# Patient Record
Sex: Female | Born: 1975 | Race: Black or African American | Hispanic: No | Marital: Single | Smoking: Never smoker
Health system: Southern US, Community
[De-identification: ages and names within clinical notes are randomized; demographics above are authoritative.]

## PROBLEM LIST (undated history)

## (undated) DIAGNOSIS — E28319 Asymptomatic premature menopause: Secondary | ICD-10-CM

## (undated) DIAGNOSIS — I10 Essential (primary) hypertension: Secondary | ICD-10-CM

## (undated) DIAGNOSIS — K317 Polyp of stomach and duodenum: Secondary | ICD-10-CM

## (undated) DIAGNOSIS — D649 Anemia, unspecified: Secondary | ICD-10-CM

## (undated) HISTORY — DX: Polyp of stomach and duodenum: K31.7

## (undated) HISTORY — PX: OTHER SURGICAL HISTORY: SHX169

## (undated) HISTORY — DX: Asymptomatic premature menopause: E28.319

## (undated) HISTORY — PX: LAPAROSCOPIC GASTRIC BANDING: SHX1100

## (undated) HISTORY — PX: OVARIAN CYST REMOVAL: SHX89

## (undated) NOTE — *Deleted (*Deleted)
1 Day Post-Op    CC: Nausea/vomiting/weight loss  Subjective: ***  Objective: Vital signs in last 24 hours: Temp:  [97 F (36.1 C)-98.8 F (37.1 C)] 98.4 F (36.9 C) (12/02 0624) Pulse Rate:  [62-129] 64 (12/02 0624) Resp:  [15-25] 16 (12/02 0624) BP: (103-185)/(57-123) 115/89 (12/02 0624) SpO2:  [95 %-100 %] 100 % (12/02 0624) FiO2 (%):  [21 %] 21 % (12/01 1602) Weight:  [64.9 kg-66.5 kg] 66.5 kg (12/02 0500) Last BM Date: 08/31/20  Intake/Output from previous day: 12/01 0701 - 12/02 0700 In: 1769.9 [P.O.:240; I.V.:1529.9] Out: 1150 [Urine:400; Emesis/NG output:750] Intake/Output this shift: Total I/O In: 967.1 [P.O.:240; I.V.:727.1] Out: 1150 [Urine:400; Emesis/NG output:750]  {physical RUEA:[id number]}  Lab Results:  Recent Labs    09/02/20 0503 09/02/20 0503 09/02/20 1751 09/03/20 0442  WBC 3.2*  --   --  4.8  HGB 9.6*   < > 10.3* 9.7*  HCT 29.3*   < > 32.6* 30.2*  PLT 157  --   --  139*   < > = values in this interval not displayed.    BMET Recent Labs    09/02/20 0503 09/03/20 0442  NA 140 141  K 2.9* 3.6  CL 105 108  CO2 27 21*  GLUCOSE 90 80  BUN 24* 24*  CREATININE 1.21* 1.08*  CALCIUM 8.8* 8.9   PT/INR No results for input(s): LABPROT, INR in the last 72 hours.  Recent Labs  Lab 09/01/20 1319 09/02/20 0503  AST 20 13*  ALT 16 11  ALKPHOS 39 29*  BILITOT 1.2 1.5*  PROT 7.7 5.9*  ALBUMIN 4.2 3.4*     Lipase     Component Value Date/Time   LIPASE 34 09/01/2020 1319     Medications: . pantoprazole (PROTONIX) IV  40 mg Intravenous Q12H   . lactated ringers with kcl 100 mL/hr at 09/03/20 9147    Assessment/Plan Hypokalemia Hematemesis AKI Anemia  Nausea/vomiting/weight loss Esophagitis/s/p lap band with stenotic lumen   FEN: IV fluids/clear liquids ID: None DVT: SCDs Follow-up: TBD       LOS: 1 day    JENNINGS,WILLARD 09/03/2020 Please see Amion

---

## 2015-05-07 ENCOUNTER — Other Ambulatory Visit: Payer: Self-pay

## 2015-05-07 ENCOUNTER — Emergency Department (HOSPITAL_COMMUNITY)
Admission: EM | Admit: 2015-05-07 | Discharge: 2015-05-08 | Disposition: A | Discharge status: Home / Self Care | Payer: Self-pay | Attending: Emergency Medicine | Admitting: Emergency Medicine

## 2015-05-07 ENCOUNTER — Encounter (HOSPITAL_COMMUNITY): Payer: Self-pay | Admitting: Vascular Surgery

## 2015-05-07 ENCOUNTER — Emergency Department (HOSPITAL_COMMUNITY): Payer: Self-pay

## 2015-05-07 DIAGNOSIS — R55 Syncope and collapse: Secondary | ICD-10-CM | POA: Insufficient documentation

## 2015-05-07 DIAGNOSIS — I10 Essential (primary) hypertension: Secondary | ICD-10-CM | POA: Insufficient documentation

## 2015-05-07 DIAGNOSIS — Y92000 Kitchen of unspecified non-institutional (private) residence as  the place of occurrence of the external cause: Secondary | ICD-10-CM | POA: Insufficient documentation

## 2015-05-07 DIAGNOSIS — S40211A Abrasion of right shoulder, initial encounter: Secondary | ICD-10-CM | POA: Insufficient documentation

## 2015-05-07 DIAGNOSIS — Y9389 Activity, other specified: Secondary | ICD-10-CM | POA: Insufficient documentation

## 2015-05-07 DIAGNOSIS — R42 Dizziness and giddiness: Secondary | ICD-10-CM | POA: Insufficient documentation

## 2015-05-07 DIAGNOSIS — W01198A Fall on same level from slipping, tripping and stumbling with subsequent striking against other object, initial encounter: Secondary | ICD-10-CM | POA: Insufficient documentation

## 2015-05-07 DIAGNOSIS — Y998 Other external cause status: Secondary | ICD-10-CM | POA: Insufficient documentation

## 2015-05-07 DIAGNOSIS — Z3202 Encounter for pregnancy test, result negative: Secondary | ICD-10-CM | POA: Insufficient documentation

## 2015-05-07 HISTORY — DX: Essential (primary) hypertension: I10

## 2015-05-07 LAB — BASIC METABOLIC PANEL
Anion gap: 7 (ref 5–15)
BUN: 14 mg/dL (ref 6–20)
CO2: 28 mmol/L (ref 22–32)
Calcium: 9.1 mg/dL (ref 8.9–10.3)
Chloride: 102 mmol/L (ref 101–111)
Creatinine, Ser: 1.01 mg/dL — ABNORMAL HIGH (ref 0.44–1.00)
GFR calc Af Amer: >60 mL/min (ref >60)
GFR calc non Af Amer: >60 mL/min (ref >60)
Glucose, Bld: 120 mg/dL — ABNORMAL HIGH (ref 65–99)
Potassium: 2.9 mmol/L — ABNORMAL LOW (ref 3.5–5.1)
Sodium: 137 mmol/L (ref 135–145)

## 2015-05-07 LAB — URINALYSIS, ROUTINE W REFLEX MICROSCOPIC
Bilirubin Urine: NEGATIVE
Glucose, UA: NEGATIVE mg/dL
Hgb urine dipstick: NEGATIVE
Ketones, ur: 15 mg/dL — AB
Leukocytes, UA: NEGATIVE
Nitrite: NEGATIVE
Protein, ur: 100 mg/dL — AB
Specific Gravity, Urine: 1.025 (ref 1.005–1.030)
Urobilinogen, UA: 1 mg/dL (ref 0.0–1.0)
pH: 7 (ref 5.0–8.0)

## 2015-05-07 LAB — CBC
HCT: 29.9 % — ABNORMAL LOW (ref 36.0–46.0)
Hemoglobin: 9.4 g/dL — ABNORMAL LOW (ref 12.0–15.0)
MCH: 22.5 pg — ABNORMAL LOW (ref 26.0–34.0)
MCHC: 31.4 g/dL (ref 30.0–36.0)
MCV: 71.7 fL — ABNORMAL LOW (ref 78.0–100.0)
Platelets: 278 10*3/uL (ref 150–400)
RBC: 4.17 MIL/uL (ref 3.87–5.11)
RDW: 15.7 % — ABNORMAL HIGH (ref 11.5–15.5)
WBC: 4.7 10*3/uL (ref 4.0–10.5)

## 2015-05-07 LAB — URINE MICROSCOPIC-ADD ON

## 2015-05-07 LAB — CBG MONITORING, ED: Glucose-Capillary: 85 mg/dL (ref 65–99)

## 2015-05-07 MED ORDER — POTASSIUM CHLORIDE CRYS ER 20 MEQ PO TBCR
40.0000 meq | EXTENDED_RELEASE_TABLET | Freq: Once | ORAL | Status: AC
  Administered 2015-05-07: 40 meq via ORAL
  Filled 2015-05-07: qty 2

## 2015-05-07 MED ORDER — SODIUM CHLORIDE 0.9 % IV BOLUS (SEPSIS)
1000.0000 mL | Freq: Once | INTRAVENOUS | Status: AC
  Administered 2015-05-07: 1000 mL via INTRAVENOUS

## 2015-05-07 NOTE — ED Notes (Signed)
Pt reports to the ED for eval of syncopal episode and right shoulder pain. She reports she was standing in the closet closing a box when she became lightheaded and had a syncopal episode. She reports she did fall with the episode but did not hit her head and denies full LOC. She reports right shoulder pain and bilateral leg pain. Some abrasions noted to her right arm. Does not wish to have an X-ray. She reports she has had a head cold so she took some Niquil cough syrup prior to the incident. Pt A&Ox4, resp e/u, and skin warm and dry.

## 2015-05-07 NOTE — ED Provider Notes (Signed)
CSN: [id number]     Arrival date & time 05/07/15  2006 History   First MD Initiated Contact with Patient 05/07/15 2218     Chief Complaint  Patient presents with  . Loss of Consciousness  . Shoulder Pain     (Consider location/radiation/quality/duration/timing/severity/associated sxs/prior Treatment) HPI Comments: Patient presents near syncopal episode. She was standing in her kitchen after taking some NyQuil for a head cold which she's had for the past 2 days. She felt lightheaded, dizzy, room spinning with nausea and blurry vision. She then fell to the side hitting her right shoulder on the stove. Did not hit her head or lose consciousness. Denies losing consciousness completely. No chest pain or shortness of breath. No focal weakness, numbness or tingling. No bowel or bladder incontinence. No tongue biting. No previous syncopal episodes. She does not take any birth control. She denies any head, neck or back pain. She endorses pain in her right shoulder with abrasion. Denies any possibility of pregnancy.  Patient is a 39 y.o. female presenting with shoulder pain. The history is provided by the patient.  Shoulder Pain Associated symptoms: no back pain, no fever and no neck pain     Past Medical History  Diagnosis Date  . Hypertension    Past Surgical History  Procedure Laterality Date  . Laparoscopic gastric banding    . Ovarian cyst removal     No family history on file. History  Substance Use Topics  . Smoking status: Never Smoker   . Smokeless tobacco: Never Used  . Alcohol Use: No   OB History    No data available     Review of Systems  Constitutional: Negative for fever, activity change and appetite change.  Respiratory: Negative for cough, chest tightness and shortness of breath.   Cardiovascular: Positive for syncope. Negative for chest pain and palpitations.  Gastrointestinal: Negative for vomiting, abdominal pain and diarrhea.  Genitourinary: Negative for dysuria,  hematuria, vaginal bleeding and vaginal discharge.  Musculoskeletal: Positive for myalgias and arthralgias. Negative for back pain, neck pain and neck stiffness.  Skin: Negative for rash.  Neurological: Positive for dizziness and light-headedness. Negative for weakness.  A complete 10 system review of systems was obtained and all systems are negative except as noted in the HPI and PMH.      Allergies  Review of patient's allergies indicates not on file.  Home Medications   Prior to Admission medications   Medication Sig Start Date End Date Taking? Authorizing Provider  Pseudoeph-Doxylamine-DM-APAP (NYQUIL PO) Take 15 mLs by mouth daily as needed (cold / congestion).   Yes Historical Provider, MD   BP 139/93 mmHg  Pulse 73  Temp(Src) 98.6 F (37 C) (Oral)  Resp 16  SpO2 99%  LMP 12/16/2014 (Approximate) Physical Exam  Constitutional: She is oriented to person, place, and time. She appears well-developed and well-nourished. No distress.  HENT:  Head: Normocephalic and atraumatic.  Mouth/Throat: Oropharynx is clear and moist. No oropharyngeal exudate.  Eyes: Conjunctivae and EOM are normal. Pupils are equal, round, and reactive to light.  Neck: Normal range of motion. Neck supple.  No C spine tenderness  Cardiovascular: Normal rate, regular rhythm, normal heart sounds and intact distal pulses.   No murmur heard. Pulmonary/Chest: Effort normal and breath sounds normal. No respiratory distress.  Abdominal: Soft. There is no tenderness. There is no rebound and no guarding.  Musculoskeletal: Normal range of motion. She exhibits tenderness. She exhibits no edema.  Abrasion R shoulder with intact  ROM. No T or L spine tenderness  Neurological: She is alert and oriented to person, place, and time. No cranial nerve deficit. She exhibits normal muscle tone. Coordination normal.  No ataxia on finger to nose bilaterally. No pronator drift. 5/5 strength throughout. CN 2-12 intact. Negative  Romberg. Equal grip strength. Sensation intact. Gait is normal.   Skin: Skin is warm.  Psychiatric: She has a normal mood and affect. Her behavior is normal.  Nursing note and vitals reviewed.   ED Course  Procedures (including critical care time) Labs Review Labs Reviewed  BASIC METABOLIC PANEL - Abnormal; Notable for the following:    Potassium 2.9 (*)    Glucose, Bld 120 (*)    Creatinine, Ser 1.01 (*)    All other components within normal limits  CBC - Abnormal; Notable for the following:    Hemoglobin 9.4 (*)    HCT 29.9 (*)    MCV 71.7 (*)    MCH 22.5 (*)    RDW 15.7 (*)    All other components within normal limits  URINALYSIS, ROUTINE W REFLEX MICROSCOPIC (NOT AT [facility]) - Abnormal; Notable for the following:    Ketones, ur 15 (*)    Protein, ur 100 (*)    All other components within normal limits  URINE MICROSCOPIC-ADD ON - Abnormal; Notable for the following:    Casts HYALINE CASTS (*)    All other components within normal limits  PREGNANCY, URINE  CBG MONITORING, ED  I-STAT BETA HCG BLOOD, ED ([facility], [facility], [facility] ONLY)    Imaging Review Dg Chest 2 View  05/07/2015   CLINICAL DATA:  Status post fall in kitchen, with lateral right shoulder pain. Initial encounter.  EXAM: CHEST  2 VIEW  COMPARISON:  None.  FINDINGS: The lungs are well-aerated and clear. There is no evidence of focal opacification, pleural effusion or pneumothorax.  The heart is normal in size; the mediastinal contour is within normal limits. No acute osseous abnormalities are seen.  IMPRESSION: No acute cardiopulmonary process seen. No displaced rib fractures identified.   Electronically Signed   By: Garald Balding M.D.   On: 05/07/2015 23:22   Dg Shoulder Right  05/07/2015   CLINICAL DATA:  Status post fall in kitchen, with right lateral shoulder pain and laceration. Initial encounter.  EXAM: RIGHT SHOULDER - 2+ VIEW  COMPARISON:  None.  FINDINGS: There is no evidence of fracture or dislocation. The right humeral  head is seated within the glenoid fossa. The acromioclavicular joint is unremarkable in appearance. No significant soft tissue abnormalities are seen. The visualized portions of the right lung are clear.  IMPRESSION: No evidence of fracture or dislocation.   Electronically Signed   By: Garald Balding M.D.   On: 05/07/2015 23:22     EKG Interpretation   Date/Time:  Thursday May 07 2015 20:21:33 EDT Ventricular Rate:  79 PR Interval:  142 QRS Duration: 88 QT Interval:  366 QTC Calculation: 419 R Axis:   72 Text Interpretation:  Normal sinus rhythm Nonspecific T wave abnormality  Abnormal ECG No previous ECGs available Confirmed by McNairy  [phone]) on 05/07/2015 10:33:45 PM      MDM   Final diagnoses:  Syncope, unspecified syncope type  Syncopal episode with prodromal dizziness, lightheadedness, nausea. Denies hitting head or losing consciousness. Complains of pain to right shoulder.  EKG normal sinus rhythm without acute ST changes. No Brugada. No prolonged QT.  Patient given IV fluids. Hemoglobin 9.4 with no  comparison. Patient reports history of anemia has never had a transfusion.  Pregnancy test is negative. Urinalysis negative. Mild hypokalemia which was replaced. Orthostatic blood pressure drops with standing.   Patient tolerating by mouth and ambulatory. Suspect vasovagal syncope in setting of medication use. No chest pain or shortness of breath. Doubt pulmonary embolism. No hypoxia or tachycardia.  Discussed oral hydration at home, follow up with PCP. Return precautions discussed.   Ezequiel Essex, MD 05/08/15 [phone]

## 2015-05-08 LAB — I-STAT BETA HCG BLOOD, ED (MC, WL, AP ONLY): I-stat hCG, quantitative: <5 m[IU]/mL (ref <5)

## 2015-05-08 LAB — PREGNANCY, URINE: Preg Test, Ur: NEGATIVE

## 2015-05-08 NOTE — ED Notes (Signed)
Pt left with all belongings and ambulated out of treatment area.  

## 2015-05-08 NOTE — Discharge Instructions (Signed)
Near-Syncope keep yourself hydrated. Follow-up with your doctor this week. Return to the ED if he develop new or worsening symptoms. Near-syncope (commonly known as near fainting) is sudden weakness, dizziness, or feeling like you might pass out. During an episode of near-syncope, you may also develop pale skin, have tunnel vision, or feel sick to your stomach (nauseous). Near-syncope may occur when getting up after sitting or while standing for a long time. It is caused by a sudden decrease in blood flow to the brain. This decrease can result from various causes or triggers, most of which are not serious. However, because near-syncope can sometimes be a sign of something serious, a medical evaluation is required. The specific cause is often not determined. HOME CARE INSTRUCTIONS  Monitor your condition for any changes. The following actions may help to alleviate any discomfort you are experiencing:  Have someone stay with you until you feel stable.  Lie down right away and prop your feet up if you start feeling like you might faint. Breathe deeply and steadily. Wait until all the symptoms have passed. Most of these episodes last only a few minutes. You may feel tired for several hours.   Drink enough fluids to keep your urine clear or pale yellow.   If you are taking blood pressure or heart medicine, get up slowly when seated or lying down. Take several minutes to sit and then stand. This can reduce dizziness.  Follow up with your health care provider as directed. SEEK IMMEDIATE MEDICAL CARE IF:   You have a severe headache.   You have unusual pain in the chest, abdomen, or back.   You are bleeding from the mouth or rectum, or you have black or tarry stool.   You have an irregular or very fast heartbeat.   You have repeated fainting or have seizure-like jerking during an episode.   You faint when sitting or lying down.   You have confusion.   You have difficulty walking.    You have severe weakness.   You have vision problems.  MAKE SURE YOU:   Understand these instructions.  Will watch your condition.  Will get help right away if you are not doing well or get worse. Document Released: 09/19/2005 Document Revised: 09/24/2013 Document Reviewed: 02/22/2013 [facility] Patient Information 2015 [place], [place]. This information is not intended to replace advice given to you by your health care provider. Make sure you discuss any questions you have with your health care provider.

## 2016-06-26 IMAGING — DX DG CHEST 2V
2 series · 2 of 2 positions shown · non-contrast
Comparison: None.

CLINICAL DATA: Status post fall in kitchen, with lateral right
shoulder pain. Initial encounter.

EXAM:
CHEST  2 VIEW

[w chest pa]
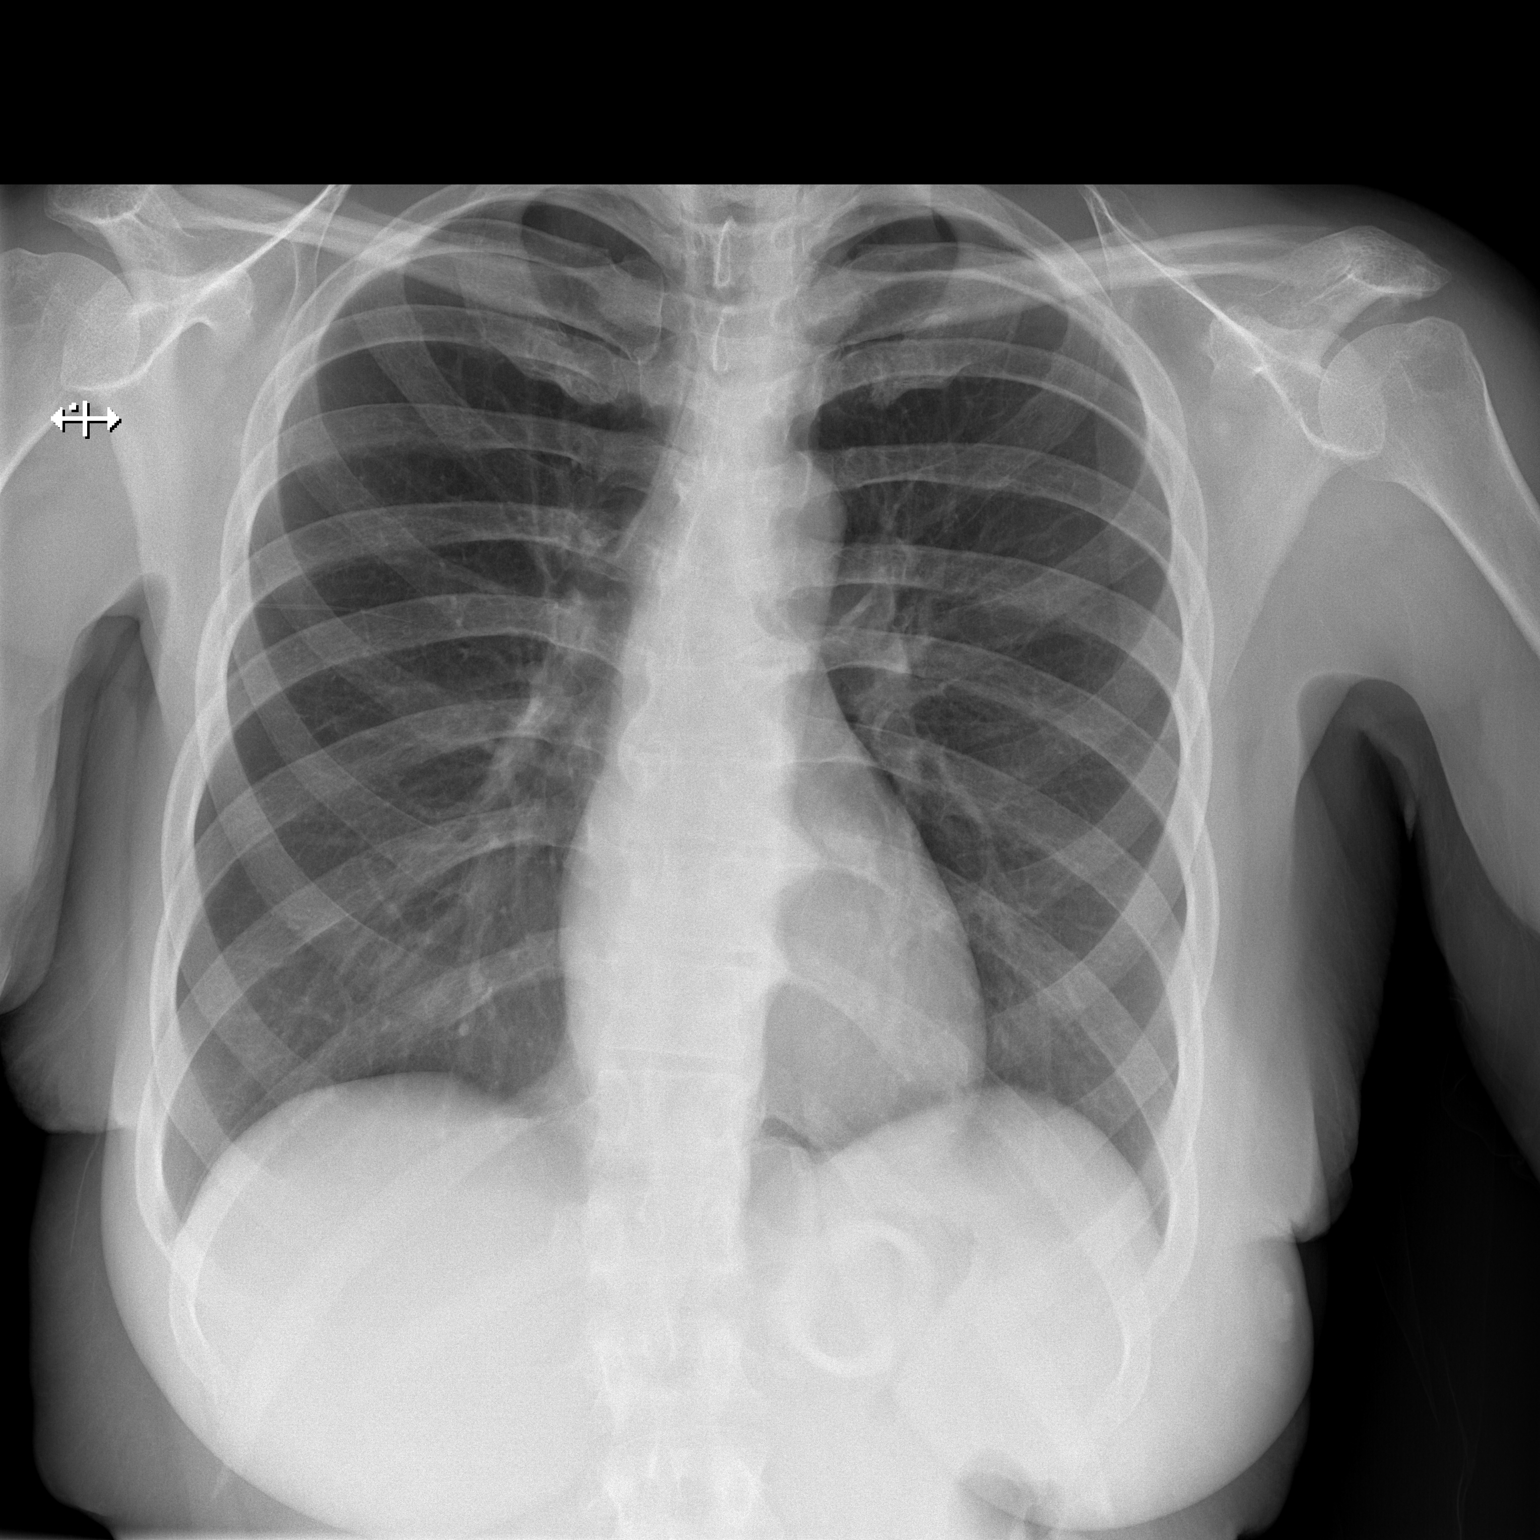

[w chest lat]
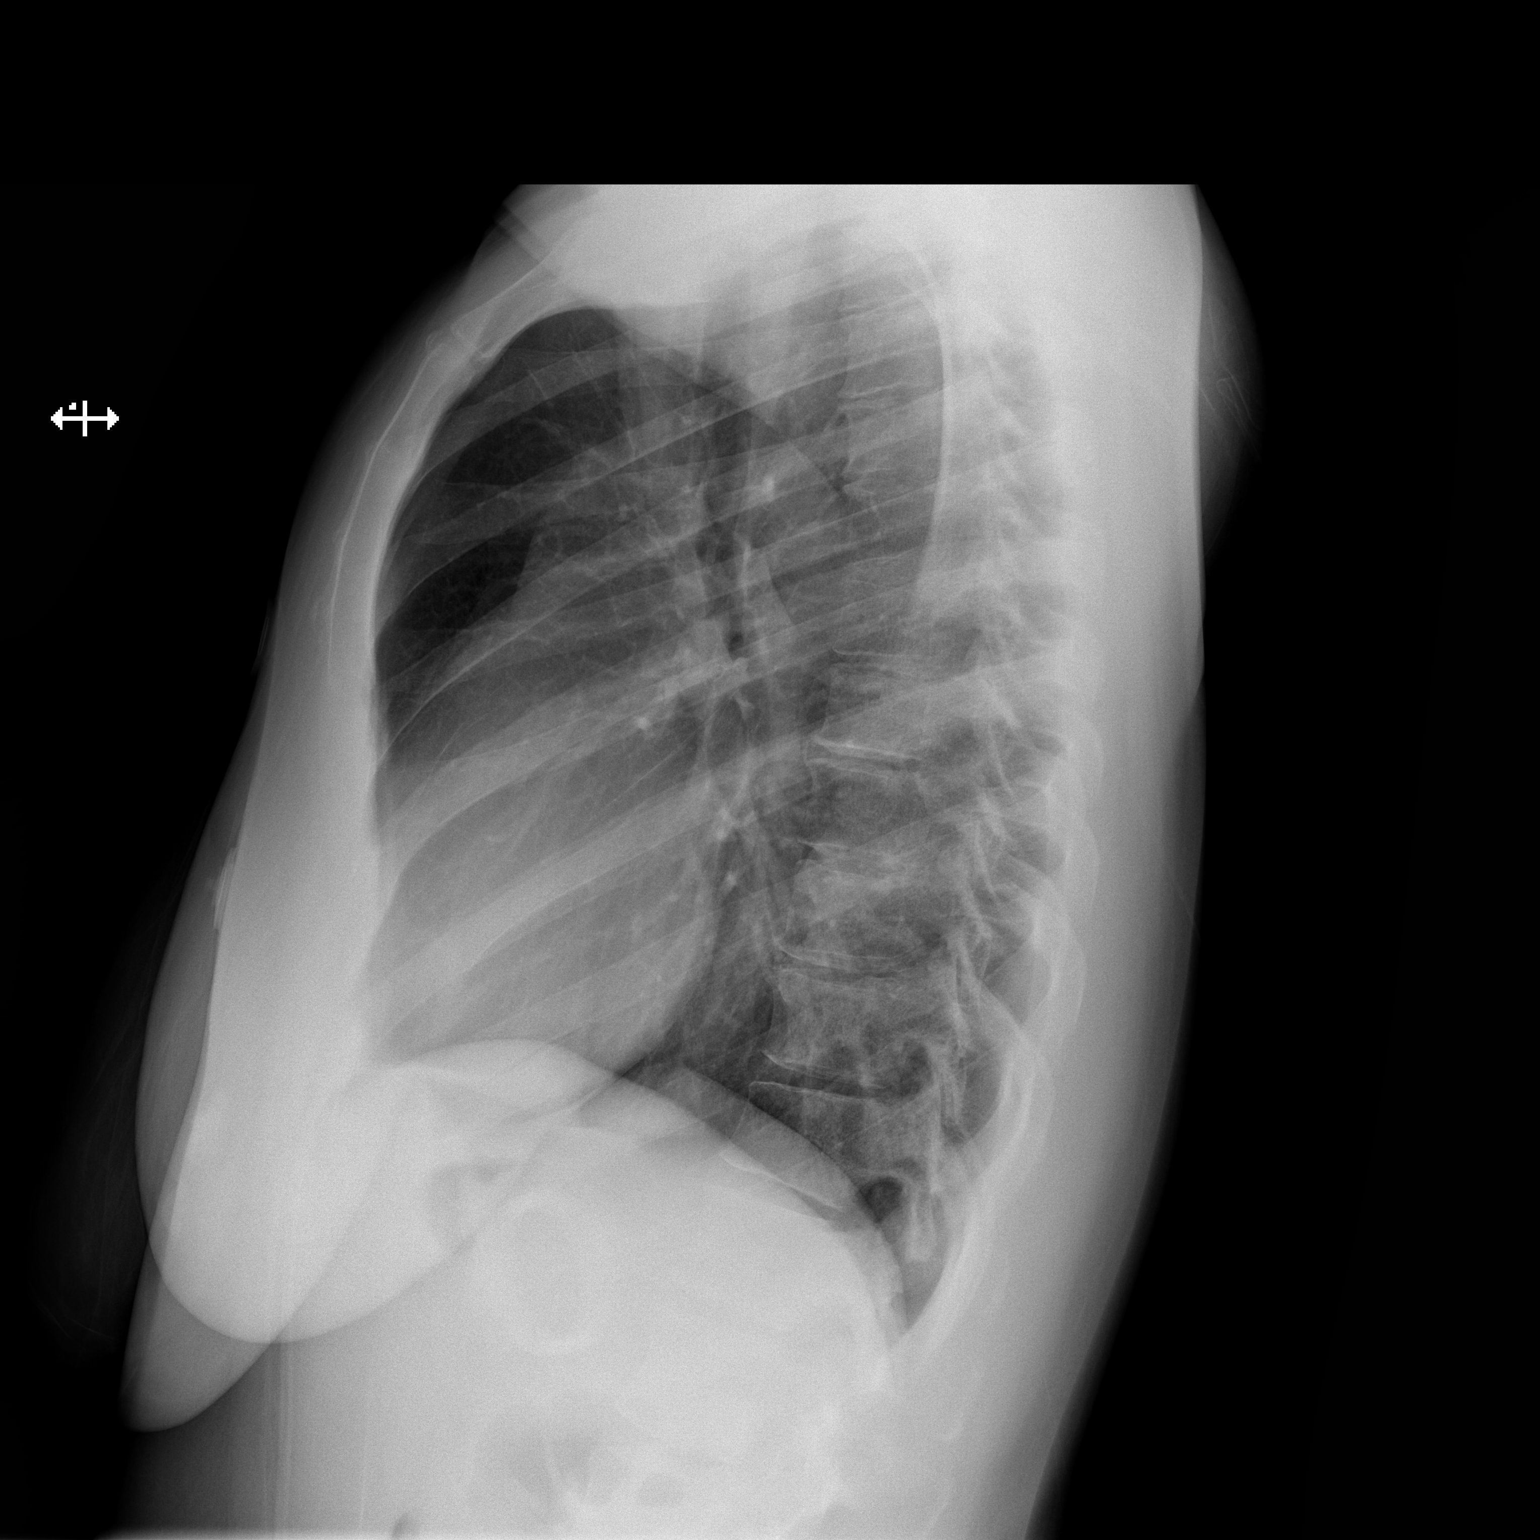

[2 of 2 positions shown; findings below may reference images not displayed]

FINDINGS: The lungs are well-aerated and clear. There is no evidence of focal
opacification, pleural effusion or pneumothorax.

The heart is normal in size; the mediastinal contour is within
normal limits. No acute osseous abnormalities are seen.
IMPRESSION: No acute cardiopulmonary process seen. No displaced rib fractures
identified.

## 2016-06-26 IMAGING — DX DG SHOULDER 2+V*R*
3 series · 3 of 3 positions shown · non-contrast
Comparison: None.

CLINICAL DATA: Status post fall in kitchen, with right lateral
shoulder pain and laceration. Initial encounter.

EXAM:
RIGHT SHOULDER - 2+ VIEW

[w shoulder external right]
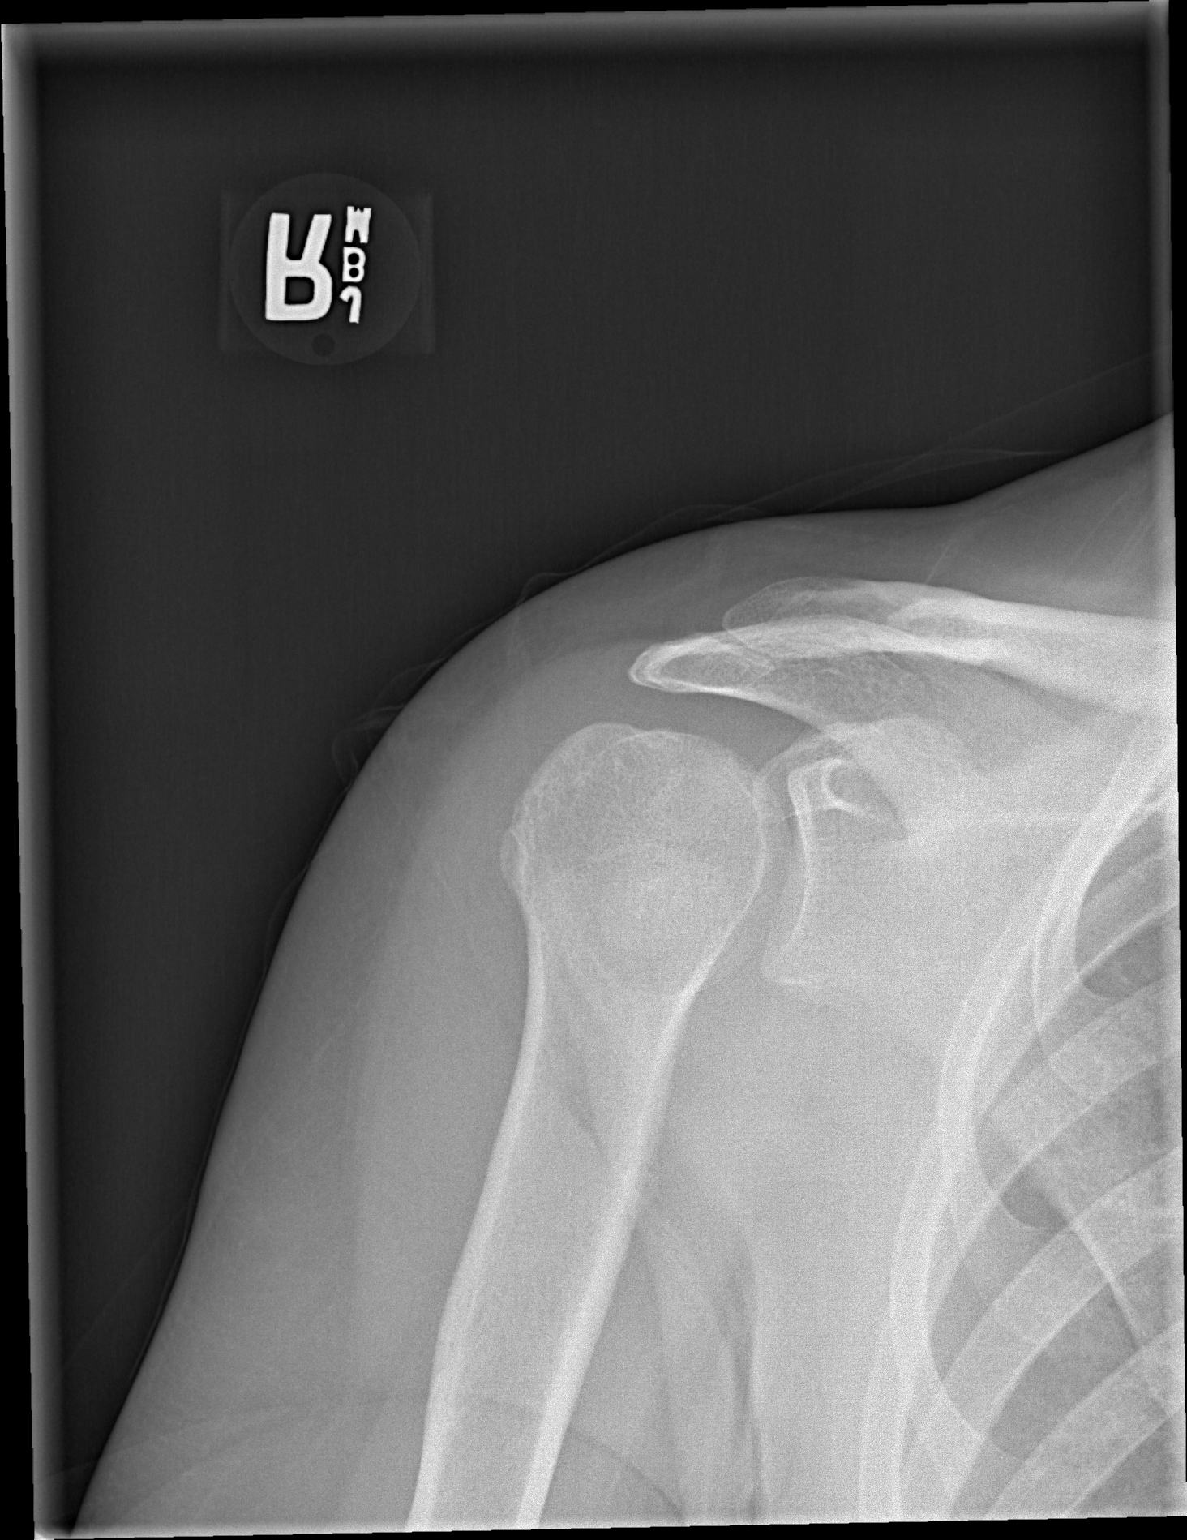

[w shoulder y-view right]
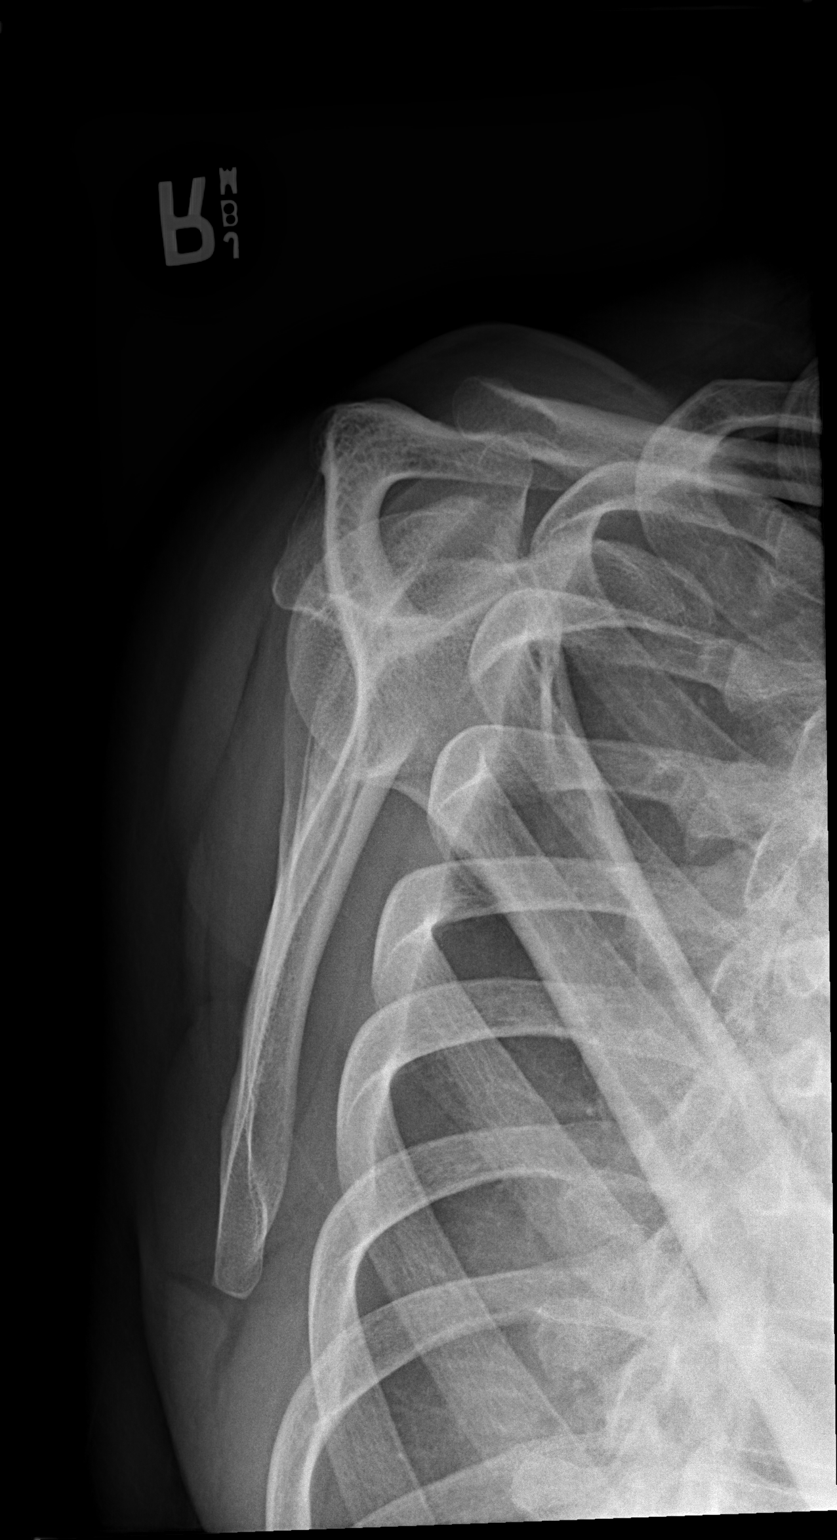

[x shoulder axillary right]
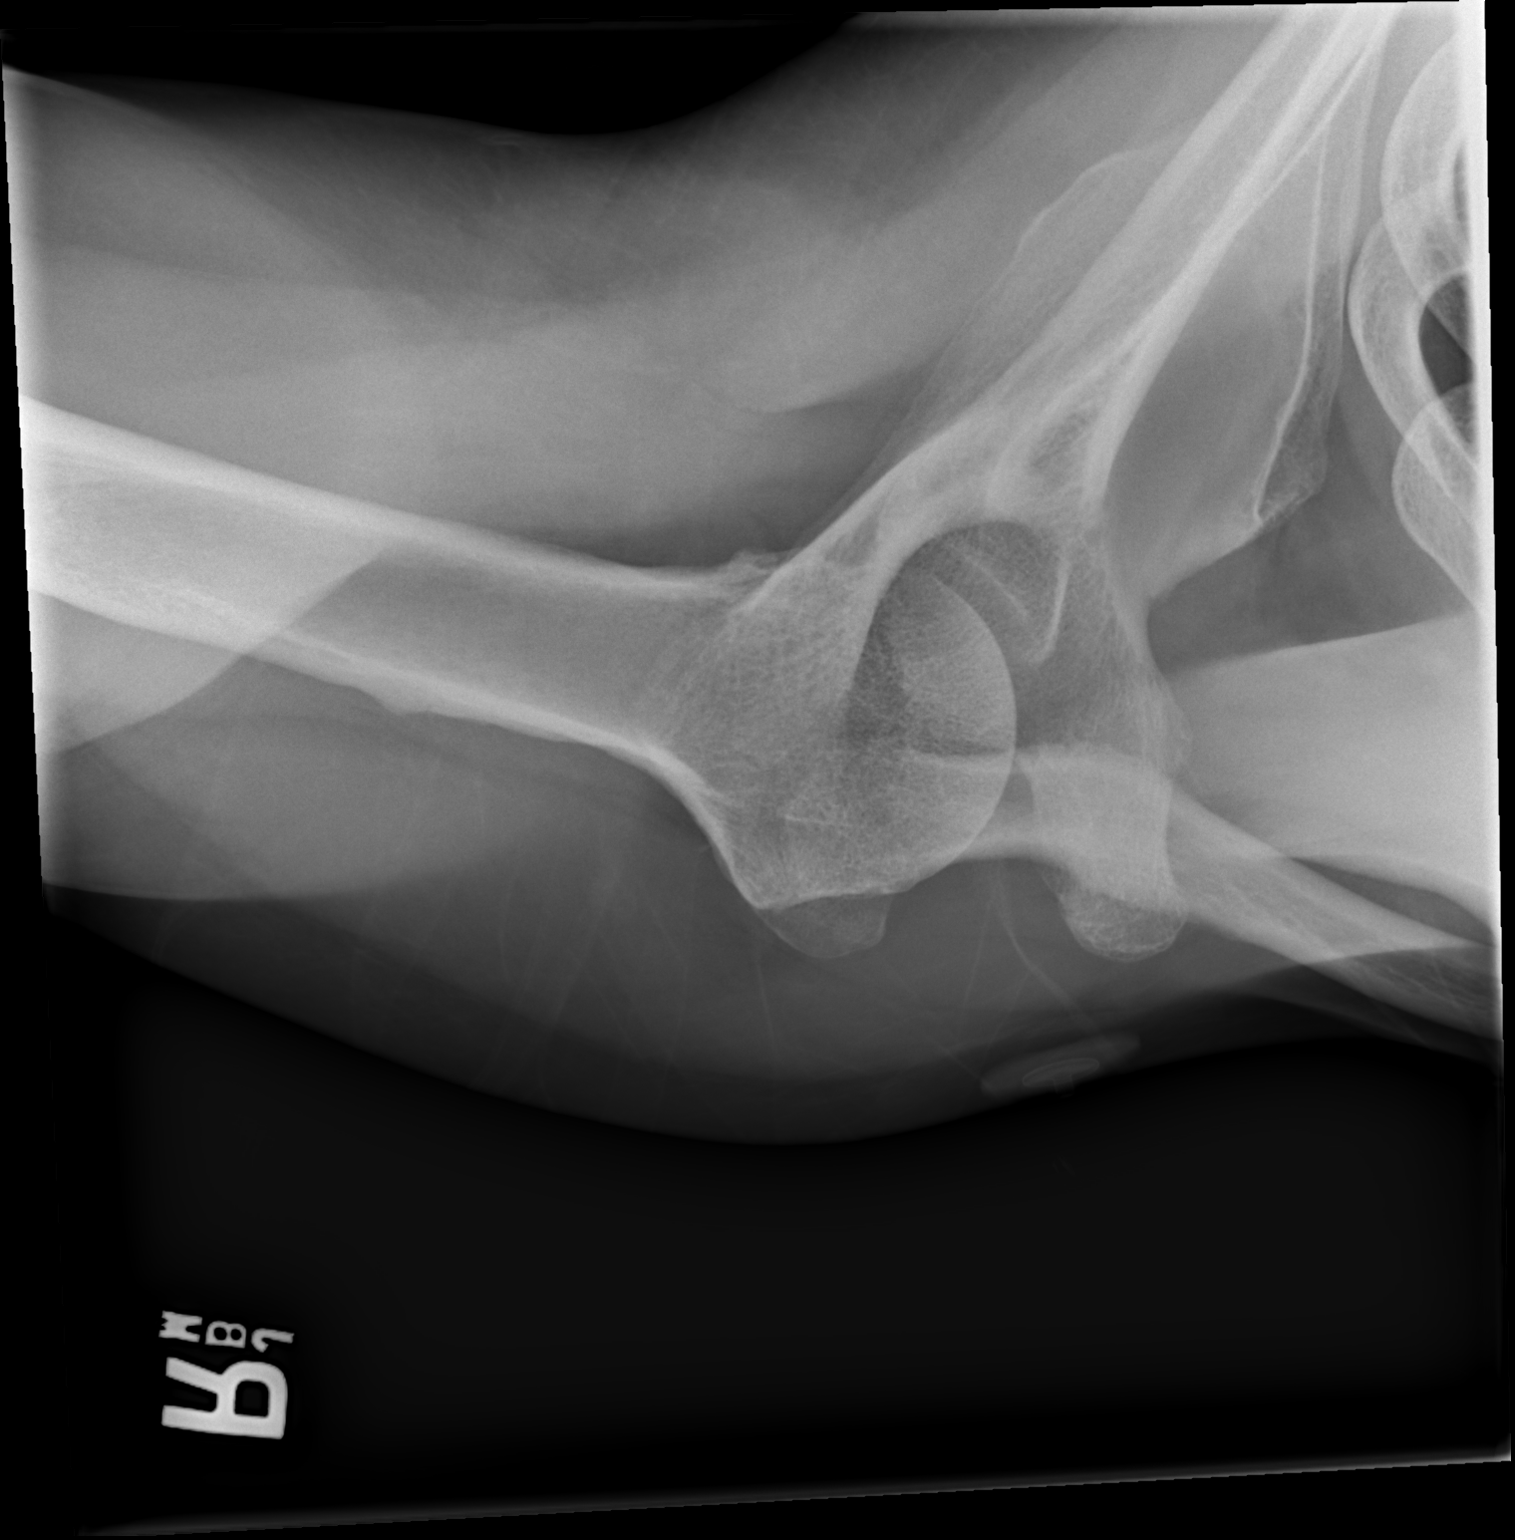

[3 of 3 positions shown; findings below may reference images not displayed]

FINDINGS: There is no evidence of fracture or dislocation. The right humeral
head is seated within the glenoid fossa. The acromioclavicular joint
is unremarkable in appearance. No significant soft tissue
abnormalities are seen. The visualized portions of the right lung
are clear.
IMPRESSION: No evidence of fracture or dislocation.

## 2016-07-30 ENCOUNTER — Encounter (HOSPITAL_COMMUNITY): Payer: Self-pay | Admitting: *Deleted

## 2016-07-30 ENCOUNTER — Emergency Department (HOSPITAL_COMMUNITY): Payer: No Typology Code available for payment source

## 2016-07-30 ENCOUNTER — Emergency Department (HOSPITAL_COMMUNITY)
Admission: EM | Admit: 2016-07-30 | Discharge: 2016-07-30 | Disposition: A | Discharge status: Home / Self Care | Payer: No Typology Code available for payment source | Attending: Emergency Medicine | Admitting: Emergency Medicine

## 2016-07-30 DIAGNOSIS — S0993XA Unspecified injury of face, initial encounter: Secondary | ICD-10-CM | POA: Diagnosis present

## 2016-07-30 DIAGNOSIS — I10 Essential (primary) hypertension: Secondary | ICD-10-CM | POA: Insufficient documentation

## 2016-07-30 DIAGNOSIS — Y939 Activity, unspecified: Secondary | ICD-10-CM | POA: Insufficient documentation

## 2016-07-30 DIAGNOSIS — Z79899 Other long term (current) drug therapy: Secondary | ICD-10-CM | POA: Insufficient documentation

## 2016-07-30 DIAGNOSIS — S0083XA Contusion of other part of head, initial encounter: Secondary | ICD-10-CM

## 2016-07-30 DIAGNOSIS — Y92410 Unspecified street and highway as the place of occurrence of the external cause: Secondary | ICD-10-CM | POA: Insufficient documentation

## 2016-07-30 DIAGNOSIS — Y999 Unspecified external cause status: Secondary | ICD-10-CM | POA: Insufficient documentation

## 2016-07-30 HISTORY — DX: Anemia, unspecified: D64.9

## 2016-07-30 LAB — CBC
HCT: 28.5 % — ABNORMAL LOW (ref 36.0–46.0)
Hemoglobin: 8.6 g/dL — ABNORMAL LOW (ref 12.0–15.0)
MCH: 20.4 pg — ABNORMAL LOW (ref 26.0–34.0)
MCHC: 30.2 g/dL (ref 30.0–36.0)
MCV: 67.7 fL — ABNORMAL LOW (ref 78.0–100.0)
Platelets: 367 10*3/uL (ref 150–400)
RBC: 4.21 MIL/uL (ref 3.87–5.11)
RDW: 17.4 % — ABNORMAL HIGH (ref 11.5–15.5)
WBC: 5.4 10*3/uL (ref 4.0–10.5)

## 2016-07-30 LAB — COMPREHENSIVE METABOLIC PANEL
ALT: 12 U/L — ABNORMAL LOW (ref 14–54)
AST: 17 U/L (ref 15–41)
Albumin: 3.6 g/dL (ref 3.5–5.0)
Alkaline Phosphatase: 59 U/L (ref 38–126)
Anion gap: 7 (ref 5–15)
BUN: 12 mg/dL (ref 6–20)
CO2: 25 mmol/L (ref 22–32)
Calcium: 8.9 mg/dL (ref 8.9–10.3)
Chloride: 104 mmol/L (ref 101–111)
Creatinine, Ser: 0.85 mg/dL (ref 0.44–1.00)
GFR calc Af Amer: >60 mL/min (ref >60)
GFR calc non Af Amer: >60 mL/min (ref >60)
Glucose, Bld: 93 mg/dL (ref 65–99)
Potassium: 3.8 mmol/L (ref 3.5–5.1)
Sodium: 136 mmol/L (ref 135–145)
Total Bilirubin: 0.8 mg/dL (ref 0.3–1.2)
Total Protein: 7.2 g/dL (ref 6.5–8.1)

## 2016-07-30 LAB — TROPONIN I: Troponin I: <0.03 ng/mL (ref <0.03)

## 2016-07-30 MED ORDER — HYDROCHLOROTHIAZIDE 12.5 MG PO CAPS
12.5000 mg | ORAL_CAPSULE | Freq: Every day | ORAL | Status: DC
  Administered 2016-07-30: 12.5 mg via ORAL
  Filled 2016-07-30: qty 1

## 2016-07-30 MED ORDER — LISINOPRIL 20 MG PO TABS
20.0000 mg | ORAL_TABLET | Freq: Once | ORAL | Status: AC
  Administered 2016-07-30: 20 mg via ORAL
  Filled 2016-07-30: qty 1

## 2016-07-30 MED ORDER — LISINOPRIL-HYDROCHLOROTHIAZIDE 20-12.5 MG PO TABS: 1.0000 | ORAL_TABLET | Freq: Every day | ORAL | 2 refills | Status: DC

## 2016-07-30 MED ORDER — ONDANSETRON 4 MG PO TBDP
4.0000 mg | ORAL_TABLET | Freq: Once | ORAL | Status: AC
  Administered 2016-07-30: 4 mg via ORAL
  Filled 2016-07-30: qty 1

## 2016-07-30 NOTE — ED Triage Notes (Signed)
The pt has not had a period in years
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
## 2016-07-30 NOTE — ED Notes (Signed)
Pt alert and oriented x's 3.  Pt's neuro exam neg
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
## 2016-07-30 NOTE — Discharge Instructions (Signed)
For pain control please take ibuprofen (also known as Motrin or Advil) 800mg (this is normally 4 over the counter pills) 3 times a day  for 5 days. Take with food to minimize stomach irritation. ° °Do not hesitate to return to the emergency room for any new, worsening or concerning symptoms. ° °Please obtain primary care using resource guide below. Let them know that you were seen in the emergency room and that they will need to obtain records for further outpatient management. ° ° ° °

## 2016-08-01 ENCOUNTER — Telehealth: Payer: Self-pay | Admitting: *Deleted

## 2018-06-12 ENCOUNTER — Ambulatory Visit: Payer: Self-pay | Admitting: Family Medicine

## 2018-07-03 ENCOUNTER — Ambulatory Visit: Payer: Self-pay | Admitting: Family Medicine

## 2018-07-05 ENCOUNTER — Encounter: Payer: Self-pay | Admitting: Family Medicine

## 2018-07-05 ENCOUNTER — Ambulatory Visit: Payer: 59 | Admitting: Family Medicine

## 2018-07-05 VITALS — BP 164/110 | HR 90 | Ht 68.75 in | Wt 188.4 lb

## 2018-07-05 DIAGNOSIS — N912 Amenorrhea, unspecified: Secondary | ICD-10-CM | POA: Diagnosis not present

## 2018-07-05 DIAGNOSIS — I10 Essential (primary) hypertension: Secondary | ICD-10-CM | POA: Diagnosis not present

## 2018-07-05 DIAGNOSIS — Z7689 Persons encountering health services in other specified circumstances: Secondary | ICD-10-CM

## 2018-07-05 HISTORY — DX: Amenorrhea, unspecified: N91.2

## 2018-07-05 LAB — POCT URINALYSIS DIP (PROADVANTAGE DEVICE)
Bilirubin, UA: NEGATIVE
Blood, UA: NEGATIVE
Glucose, UA: NEGATIVE mg/dL
Ketones, POC UA: NEGATIVE mg/dL
Leukocytes, UA: NEGATIVE
Nitrite, UA: NEGATIVE
Protein Ur, POC: NEGATIVE mg/dL
Specific Gravity, Urine: 1.015
Urobilinogen, Ur: NEGATIVE
pH, UA: 6.5 (ref 5.0–8.0)

## 2018-07-05 LAB — POCT URINE PREGNANCY: Preg Test, Ur: NEGATIVE

## 2018-07-05 MED ORDER — LISINOPRIL-HYDROCHLOROTHIAZIDE 20-12.5 MG PO TABS: 1.0000 | ORAL_TABLET | Freq: Every day | ORAL | 1 refills | Status: DC

## 2018-07-05 NOTE — Patient Instructions (Addendum)
I will call you in the morning with your labs and then start you on medication  Check your blood pressure daily and keep a record of your readings. Make sure you are eating a healthy diet that is low in sodium.  Return to see me in 2 to 3 weeks and bring in your blood pressure machine along with a cuff in your readings.  We will call you with your lab results  DASH Eating Plan DASH stands for "Dietary Approaches to Stop Hypertension." The DASH eating plan is a healthy eating plan that has been shown to reduce high blood pressure (hypertension). It may also reduce your risk for type 2 diabetes, heart disease, and stroke. The DASH eating plan may also help with weight loss. What are tips for following this plan? General guidelines  Avoid eating more than 2,300 mg (milligrams) of salt (sodium) a day. If you have hypertension, you may need to reduce your sodium intake to 1,500 mg a day.  Limit alcohol intake to no more than 1 drink a day for nonpregnant women and 2 drinks a day for men. One drink equals 12 oz of beer, 5 oz of wine, or 1 oz of hard liquor.  Work with your health care provider to maintain a healthy body weight or to lose weight. Ask what an ideal weight is for you.  Get at least 30 minutes of exercise that causes your heart to beat faster (aerobic exercise) most days of the week. Activities may include walking, swimming, or biking.  Work with your health care provider or diet and nutrition specialist (dietitian) to adjust your eating plan to your individual calorie needs. Reading food labels  Check food labels for the amount of sodium per serving. Choose foods with less than 5 percent of the Daily Value of sodium. Generally, foods with less than 300 mg of sodium per serving fit into this eating plan.  To find whole grains, look for the word "whole" as the first word in the ingredient list. Shopping  Buy products labeled as "low-sodium" or "no salt added."  Buy fresh foods.  Avoid canned foods and premade or frozen meals. Cooking  Avoid adding salt when cooking. Use salt-free seasonings or herbs instead of table salt or sea salt. Check with your health care provider or pharmacist before using salt substitutes.  Do not fry foods. Cook foods using healthy methods such as baking, boiling, grilling, and broiling instead.  Cook with heart-healthy oils, such as olive, canola, soybean, or sunflower oil. Meal planning   Eat a balanced diet that includes: ? 5 or more servings of fruits and vegetables each day. At each meal, try to fill half of your plate with fruits and vegetables. ? Up to 6-8 servings of whole grains each day. ? Less than 6 oz of lean meat, poultry, or fish each day. A 3-oz serving of meat is about the same size as a deck of cards. One egg equals 1 oz. ? 2 servings of low-fat dairy each day. ? A serving of nuts, seeds, or beans 5 times each week. ? Heart-healthy fats. Healthy fats called Omega-3 fatty acids are found in foods such as flaxseeds and coldwater fish, like sardines, salmon, and mackerel.  Limit how much you eat of the following: ? Canned or prepackaged foods. ? Food that is high in trans fat, such as fried foods. ? Food that is high in saturated fat, such as fatty meat. ? Sweets, desserts, sugary drinks, and other foods  with added sugar. ? Full-fat dairy products.  Do not salt foods before eating.  Try to eat at least 2 vegetarian meals each week.  Eat more home-cooked food and less restaurant, buffet, and fast food.  When eating at a restaurant, ask that your food be prepared with less salt or no salt, if possible. What foods are recommended? The items listed may not be a complete list. Talk with your dietitian about what dietary choices are best for you. Grains Whole-grain or whole-wheat bread. Whole-grain or whole-wheat pasta. Brown rice. Modena Morrow. Bulgur. Whole-grain and low-sodium cereals. Pita bread. Low-fat,  low-sodium crackers. Whole-wheat flour tortillas. Vegetables Fresh or frozen vegetables (raw, steamed, roasted, or grilled). Low-sodium or reduced-sodium tomato and vegetable juice. Low-sodium or reduced-sodium tomato sauce and tomato paste. Low-sodium or reduced-sodium canned vegetables. Fruits All fresh, dried, or frozen fruit. Canned fruit in natural juice (without added sugar). Meat and other protein foods Skinless chicken or [place]. Ground chicken or [place]. Pork with fat trimmed off. Fish and seafood. Egg whites. Dried beans, peas, or lentils. Unsalted nuts, nut butters, and seeds. Unsalted canned beans. Lean cuts of beef with fat trimmed off. Low-sodium, lean deli meat. Dairy Low-fat (1%) or fat-free (skim) milk. Fat-free, low-fat, or reduced-fat cheeses. Nonfat, low-sodium ricotta or cottage cheese. Low-fat or nonfat yogurt. Low-fat, low-sodium cheese. Fats and oils Soft margarine without trans fats. Vegetable oil. Low-fat, reduced-fat, or light mayonnaise and salad dressings (reduced-sodium). Canola, safflower, olive, soybean, and sunflower oils. Avocado. Seasoning and other foods Herbs. Spices. Seasoning mixes without salt. Unsalted popcorn and pretzels. Fat-free sweets. What foods are not recommended? The items listed may not be a complete list. Talk with your dietitian about what dietary choices are best for you. Grains Baked goods made with fat, such as croissants, muffins, or some breads. Dry pasta or rice meal packs. Vegetables Creamed or fried vegetables. Vegetables in a cheese sauce. Regular canned vegetables (not low-sodium or reduced-sodium). Regular canned tomato sauce and paste (not low-sodium or reduced-sodium). Regular tomato and vegetable juice (not low-sodium or reduced-sodium). Angie Fava. Olives. Fruits Canned fruit in a light or heavy syrup. Fried fruit. Fruit in cream or butter sauce. Meat and other protein foods Fatty cuts of meat. Ribs. Fried meat. Berniece Salines. Sausage.  Bologna and other processed lunch meats. Salami. Fatback. Hotdogs. Bratwurst. Salted nuts and seeds. Canned beans with added salt. Canned or smoked fish. Whole eggs or egg yolks. Chicken or [place] with skin. Dairy Whole or 2% milk, cream, and half-and-half. Whole or full-fat cream cheese. Whole-fat or sweetened yogurt. Full-fat cheese. Nondairy creamers. Whipped toppings. Processed cheese and cheese spreads. Fats and oils Butter. Stick margarine. Lard. Shortening. Ghee. Bacon fat. Tropical oils, such as coconut, palm kernel, or palm oil. Seasoning and other foods Salted popcorn and pretzels. Onion salt, garlic salt, seasoned salt, table salt, and sea salt. Worcestershire sauce. Tartar sauce. Barbecue sauce. Teriyaki sauce. Soy sauce, including reduced-sodium. Steak sauce. Canned and packaged gravies. Fish sauce. Oyster sauce. Cocktail sauce. Horseradish that you find on the shelf. Ketchup. Mustard. Meat flavorings and tenderizers. Bouillon cubes. Hot sauce and Tabasco sauce. Premade or packaged marinades. Premade or packaged taco seasonings. Relishes. Regular salad dressings. Where to find more information:  National Heart, Lung, and [place]: https://wilson-eaton.com/  American Heart Association: www.heart.org Summary  The DASH eating plan is a healthy eating plan that has been shown to reduce high blood pressure (hypertension). It may also reduce your risk for type 2 diabetes, heart disease, and stroke.  With the  DASH eating plan, you should limit salt (sodium) intake to 2,300 mg a day. If you have hypertension, you may need to reduce your sodium intake to 1,500 mg a day.  When on the DASH eating plan, aim to eat more fresh fruits and vegetables, whole grains, lean proteins, low-fat dairy, and heart-healthy fats.  Work with your health care provider or diet and nutrition specialist (dietitian) to adjust your eating plan to your individual calorie needs. This information is not intended to  replace advice given to you by your health care provider. Make sure you discuss any questions you have with your health care provider. Document Released: 09/08/2011 Document Revised: 09/12/2016 Document Reviewed: 09/12/2016 Elsevier Interactive Patient Education  2018 [facility] [employer]:   [facility] [address] [place], [place] [phone]  [facility] Address: [address]                   [place], [place] [address] Phone: [phone]  [facility] [address] [place], [place] [address] Phone: [phone]  [place], [place] [address] Phone: [phone]

## 2018-07-05 NOTE — Progress Notes (Signed)
Subjective:    Patient ID: Anna Zimmerman, female    DOB: 1976/04/13, 42 y.o.   MRN: [id number]  HPI Chief Complaint  Patient presents with  . blood pressure issue    blood pressure been running high due to stress. hasn't had med for 2 years. declines flu shot   She is new to the practice and here to establish care and with concerns regarding HTN.  Previous medical care:  None in 2 years  Diagnosed with HTN in her early 78s. States she has been off of medications for at least 2 years. States she lost her insurance and then never went back to the doctor to get back on medications.  Does not check her BP at home.   States her BP was well controlled on medication.   States she had a lapband in 2010. Lost over 100 lbs and BP was much better.   Denies fever, chills, dizziness, vision changes, headaches, chest pain, palpitations, shortness of breath, orthopnea, abdominal pain, N/V/D, urinary symptoms, LE edema.   Reports having 2 teenagers. Single mom. Her mother helps. 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   Does not smoke. Rarely alcohol. No drug use.   Takes motrin as needed for headaches.   She has been not eating regular meals or drinking enough water.   States she stopped having her periods in 2010 after having her right ovary removed due to a cyst. This was done in [place].  Questions why she stopped having periods at age 12. Denies ever having this worked up.   Not currently on birth control and is sexually active.   ?history of anemia.   Eye exam in January 2019.   Reviewed allergies, medications, past medical, surgical, family, and social history.   Review of Systems Pertinent positives and negatives in the history of present illness.     Objective:   Physical Exam BP (!) 164/110   Pulse 90   Ht 5' 8.75" (1.746 m)   Wt 188 lb 6.4 oz (85.5 kg)   BMI 28.02 kg/m  Alert and in no distress. Conjunctiva normal. PERRLA, EOMs intact.  Unable to visualize fundi well.  Pharyngeal area is normal. Neck is supple without adenopathy or thyromegaly. Cardiac exam shows a regular sinus rhythm without murmurs or gallops. Lungs are clear to auscultation. Extremities without edema, pulses intact. Skin is warm and dry, no pallor.  CN II-IX intact. DTRs normal and symmetric, no clonus.   Urinalysis dipstick: negative.      Assessment & Plan:  Uncontrolled hypertension - Plan: POCT Urinalysis DIP (Proadvantage Device), lisinopril-hydrochlorothiazide (ZESTORETIC) 20-12.5 MG tablet, CBC with Differential/Platelet, Comprehensive metabolic panel, EKG 46-OEHO  Amenorrhea - Plan: TSH, T4, free, Follicle Stimulating Hormone, Estradiol, POCT urine pregnancy, Beta hCG quant (ref lab)  Encounter to establish care  UPT negative but the initial one did appear positive per my CMA it was left too long. I am checking serum HCG to confirm. Unclear as to why she has not had a period in 9 years and this has never been worked up per patient. Check labs and most likely will need to refer to OB/GYN.  No sign of any obvious end-organ damage.  ECG does shows a strain pattern per Dr. Redmond School. Asymptomatic.  Check labs. Start her back on medication. She reports BP was well controlled on dual therapy in past.  Counseling done on healthy lifestyle with diet and exercise and low sodium.  DASH diet  handout given.  Follow up pending labs and in 2-3 weeks. She will bring in BP machine and readings.

## 2018-07-06 ENCOUNTER — Other Ambulatory Visit: Payer: Self-pay | Admitting: Family Medicine

## 2018-07-06 DIAGNOSIS — D649 Anemia, unspecified: Secondary | ICD-10-CM | POA: Insufficient documentation

## 2018-07-06 LAB — COMPREHENSIVE METABOLIC PANEL
ALT: 7 IU/L (ref 0–32)
AST: 12 IU/L (ref 0–40)
Albumin/Globulin Ratio: 1.3 (ref 1.2–2.2)
Albumin: 3.9 g/dL (ref 3.5–5.5)
Alkaline Phosphatase: 54 IU/L (ref 39–117)
BUN/Creatinine Ratio: 16 (ref 9–23)
BUN: 15 mg/dL (ref 6–24)
Bilirubin Total: 1 mg/dL (ref 0.0–1.2)
CO2: 23 mmol/L (ref 20–29)
Calcium: 9.2 mg/dL (ref 8.7–10.2)
Chloride: 104 mmol/L (ref 96–106)
Creatinine, Ser: 0.91 mg/dL (ref 0.57–1.00)
GFR calc Af Amer: 91 mL/min/{1.73_m2} (ref >59)
GFR calc non Af Amer: 79 mL/min/{1.73_m2} (ref >59)
Globulin, Total: 3.1 g/dL (ref 1.5–4.5)
Glucose: 85 mg/dL (ref 65–99)
Potassium: 3.9 mmol/L (ref 3.5–5.2)
Sodium: 142 mmol/L (ref 134–144)
Total Protein: 7 g/dL (ref 6.0–8.5)

## 2018-07-06 LAB — CBC WITH DIFFERENTIAL/PLATELET
Basophils Absolute: 0 10*3/uL (ref 0.0–0.2)
Basos: 0 %
EOS (ABSOLUTE): 0 10*3/uL (ref 0.0–0.4)
Eos: 0 %
Hematocrit: 27 % — ABNORMAL LOW (ref 34.0–46.6)
Hemoglobin: 7.7 g/dL — ABNORMAL LOW (ref 11.1–15.9)
Immature Grans (Abs): 0 10*3/uL (ref 0.0–0.1)
Immature Granulocytes: 0 %
Lymphocytes Absolute: 1.1 10*3/uL (ref 0.7–3.1)
Lymphs: 14 %
MCH: 18.7 pg — ABNORMAL LOW (ref 26.6–33.0)
MCHC: 28.5 g/dL — ABNORMAL LOW (ref 31.5–35.7)
MCV: 66 fL — ABNORMAL LOW (ref 79–97)
Monocytes Absolute: 0.3 10*3/uL (ref 0.1–0.9)
Monocytes: 4 %
Neutrophils Absolute: 6.1 10*3/uL (ref 1.4–7.0)
Neutrophils: 82 %
Platelets: 350 10*3/uL (ref 150–450)
RBC: 4.12 x10E6/uL (ref 3.77–5.28)
RDW: 19.5 % — ABNORMAL HIGH (ref 12.3–15.4)
WBC: 7.6 10*3/uL (ref 3.4–10.8)

## 2018-07-06 LAB — BETA HCG QUANT (REF LAB): hCG Quant: 3 m[IU]/mL

## 2018-07-06 LAB — TSH: TSH: 0.696 u[IU]/mL (ref 0.450–4.500)

## 2018-07-06 LAB — T4, FREE: Free T4: 1.45 ng/dL (ref 0.82–1.77)

## 2018-07-06 LAB — FOLLICLE STIMULATING HORMONE: FSH: 94.4 m[IU]/mL

## 2018-07-06 LAB — ESTRADIOL: Estradiol: <5 pg/mL

## 2018-07-06 MED ORDER — DOCUSATE SODIUM 100 MG PO CAPS: 100.0000 mg | ORAL_CAPSULE | Freq: Two times a day (BID) | ORAL | 0 refills | Status: DC

## 2018-07-06 MED ORDER — FERROUS GLUCONATE 324 (38 FE) MG PO TABS
324.0000 mg | ORAL_TABLET | Freq: Three times a day (TID) | ORAL | 3 refills | Status: DC
Start: 2018-07-06 — End: 2019-02-28

## 2018-07-09 ENCOUNTER — Other Ambulatory Visit: Payer: Self-pay | Admitting: Internal Medicine

## 2018-07-09 DIAGNOSIS — D649 Anemia, unspecified: Secondary | ICD-10-CM

## 2018-07-10 ENCOUNTER — Encounter: Payer: Self-pay | Admitting: Internal Medicine

## 2018-07-10 LAB — IRON AND TIBC
Iron Saturation: 4 % — CL (ref 15–55)
Iron: 16 ug/dL — ABNORMAL LOW (ref 27–159)
Total Iron Binding Capacity: 379 ug/dL (ref 250–450)
UIBC: 363 ug/dL (ref 131–425)

## 2018-07-10 LAB — FERRITIN: Ferritin: 5 ng/mL — ABNORMAL LOW (ref 15–150)

## 2018-07-10 LAB — SPECIMEN STATUS REPORT

## 2018-07-25 ENCOUNTER — Ambulatory Visit (INDEPENDENT_AMBULATORY_CARE_PROVIDER_SITE_OTHER): Payer: 59 | Admitting: Family Medicine

## 2018-07-25 ENCOUNTER — Encounter: Payer: Self-pay | Admitting: Family Medicine

## 2018-07-25 VITALS — BP 140/90 | HR 99 | Wt 179.2 lb

## 2018-07-25 DIAGNOSIS — I10 Essential (primary) hypertension: Secondary | ICD-10-CM | POA: Diagnosis not present

## 2018-07-25 DIAGNOSIS — D509 Iron deficiency anemia, unspecified: Secondary | ICD-10-CM

## 2018-07-25 DIAGNOSIS — Z79899 Other long term (current) drug therapy: Secondary | ICD-10-CM | POA: Diagnosis not present

## 2018-07-25 DIAGNOSIS — Z13220 Encounter for screening for lipoid disorders: Secondary | ICD-10-CM

## 2018-07-25 MED ORDER — LISINOPRIL 40 MG PO TABS: 40.0000 mg | ORAL_TABLET | Freq: Every day | ORAL | 2 refills | Status: DC

## 2018-07-25 MED ORDER — HYDROCHLOROTHIAZIDE 12.5 MG PO CAPS: 12.5000 mg | ORAL_CAPSULE | Freq: Every day | ORAL | 2 refills | Status: DC

## 2018-07-25 NOTE — Patient Instructions (Signed)
Stop combination lisinopril/HCTZ medication and start the lisinopril 40 mg tablet and HCTZ 12.5 mg tablet. We are increasing your dose of the lisinopril.  Bring in your BP machine for a nurse visit and have your cuff compared to ours.   Continue eating a low sodium diet.   We will recheck your hemoglobin and iron levels and call you. I am glad to hear that your energy level has improved.   Call and schedule with an OB/GYN as discussed.   Return in 4 weeks for follow up on hypertension.

## 2018-07-25 NOTE — Progress Notes (Signed)
   Subjective:    Patient ID: Anna Zimmerman, female    DOB: 10-04-1975, 42 y.o.   MRN: [id number]  HPI Chief Complaint  Patient presents with  . HTN    HTN- being running high due to stress with teenagers   She is here to follow up on HTN and anemia.  BP at home has been in the 140s-150s/103 but she did not bring in her BP machine for comparison as requested.   Daily compliance with medication is good reportedly.   Anemia- chronic. Fairly asymptomatic at her previous visit with a Hgb of 7.7 States she is taking iron 3 times per day and stool softener twice per day and not having any issues.  States she has more energy and feels so much better overall. Not craving ice as often.   Denies any obvious bleeding.   Amenorrhea- labs showed she is post-menopausal. States she has not scheduled with an OB/GYN yet. She does recall some hot flashes and night sweats intermittently but not bothersome.   Denies fever, chills, dizziness, chest pain, palpitations, shortness of breath, abdominal pain, N/V/D.   Reviewed allergies, medications, past medical, surgical, family, and social history.   Review of Systems Pertinent positives and negatives in the history of present illness.     Objective:   Physical Exam BP 140/90   Pulse 99   Wt 179 lb 3.2 oz (81.3 kg)   BMI 26.66 kg/m   Alert and oriented and in no distress.  Pharyngeal area is normal. MMM without pallor.  Cardiac exam shows a regular sinus rhythm without murmurs or gallops. Lungs are clear to auscultation. Skin is warm and ry, no pallor.       Assessment & Plan:  Uncontrolled hypertension - Plan: lisinopril (PRINIVIL,ZESTRIL) 40 MG tablet, hydrochlorothiazide (MICROZIDE) 12.5 MG capsule  Iron deficiency anemia, unspecified iron deficiency anemia type - Plan: CBC with Differential/Platelet, Iron, TIBC and Ferritin Panel  Medication management - Plan: CBC with Differential/Platelet, Iron, TIBC and Ferritin Panel  Screening  for lipid disorders - Plan: Lipid panel  Blood pressure has significantly improved but still not at goal.  Her readings from home are much higher than our reading today.  She did not bring in her blood pressure machine for comparison as requested but will bring this back for a nurse visit in the next week. Reports good daily compliance on medication and low-sodium diet.  History of noncompliance. She will stop the combination lisinopril/ HCTZ tablet and I will increase lisinopril to 40 mg and keep HCTZ at 12.5 mg.  She has been taking daily iron and reports improvement in energy level.  Denies obvious bleeding.  She has been anemic for some time.  Stopped having menstrual cycles years ago so this is not contributing.  She has an upcoming appointment with GI to rule out bleeding. She does plan to call and schedule with OB/GYN due to early menopause.   We need to address lipids, this is not been checked in years per patient. Follow-up in 4 weeks for hypertension

## 2018-07-26 LAB — CBC WITH DIFFERENTIAL/PLATELET
Basophils Absolute: 0 10*3/uL (ref 0.0–0.2)
Basos: 1 %
EOS (ABSOLUTE): 0 10*3/uL (ref 0.0–0.4)
Eos: 0 %
Hematocrit: 32.3 % — ABNORMAL LOW (ref 34.0–46.6)
Hemoglobin: 10 g/dL — ABNORMAL LOW (ref 11.1–15.9)
Immature Grans (Abs): 0 10*3/uL (ref 0.0–0.1)
Immature Granulocytes: 0 %
Lymphocytes Absolute: 1.2 10*3/uL (ref 0.7–3.1)
Lymphs: 20 %
MCH: 20.7 pg — ABNORMAL LOW (ref 26.6–33.0)
MCHC: 31 g/dL — ABNORMAL LOW (ref 31.5–35.7)
MCV: 67 fL — ABNORMAL LOW (ref 79–97)
Monocytes Absolute: 0.4 10*3/uL (ref 0.1–0.9)
Monocytes: 7 %
Neutrophils Absolute: 4.1 10*3/uL (ref 1.4–7.0)
Neutrophils: 72 %
Platelets: 328 10*3/uL (ref 150–450)
RBC: 4.82 x10E6/uL (ref 3.77–5.28)
RDW: 25.1 % — ABNORMAL HIGH (ref 12.3–15.4)
WBC: 5.7 10*3/uL (ref 3.4–10.8)

## 2018-07-26 LAB — IRON,TIBC AND FERRITIN PANEL
Ferritin: 22 ng/mL (ref 15–150)
Iron Saturation: 5 % — CL (ref 15–55)
Iron: 20 ug/dL — ABNORMAL LOW (ref 27–159)
Total Iron Binding Capacity: 374 ug/dL (ref 250–450)
UIBC: 354 ug/dL (ref 131–425)

## 2018-07-26 LAB — LIPID PANEL
Chol/HDL Ratio: 1.8 ratio (ref 0.0–4.4)
Cholesterol, Total: 126 mg/dL (ref 100–199)
HDL: 69 mg/dL (ref >39)
LDL Calculated: 47 mg/dL (ref 0–99)
Triglycerides: 48 mg/dL (ref 0–149)
VLDL Cholesterol Cal: 10 mg/dL (ref 5–40)

## 2018-07-31 ENCOUNTER — Telehealth: Payer: Self-pay | Admitting: Internal Medicine

## 2018-07-31 ENCOUNTER — Other Ambulatory Visit: Payer: 59

## 2018-07-31 NOTE — Telephone Encounter (Signed)
Pt was notified.  shane advised me to tell patient to take BP different times and not the same time everyday to see what her bp is ranging

## 2018-07-31 NOTE — Telephone Encounter (Signed)
Pt came in this morning for BP check.   Her cuff- 140/108  Our Cuff- 140/100   Pt took Bp med prior to coming in and she is under stress with her children. She said she just started on her new bp med. I advised her to watch her BP readings and if they continue to stay high then let us know. She does follow-up in 4 weeks for her HTN

## 2018-07-31 NOTE — Telephone Encounter (Signed)
Thanks. Yes, she may need medications adjusted but if she just started then we will need to see how she does with her current dose.

## 2018-08-08 ENCOUNTER — Ambulatory Visit: Payer: 59 | Admitting: Internal Medicine

## 2018-08-27 ENCOUNTER — Ambulatory Visit: Payer: 59 | Admitting: Family Medicine

## 2018-08-28 ENCOUNTER — Ambulatory Visit: Payer: 59 | Admitting: Internal Medicine

## 2018-10-12 ENCOUNTER — Telehealth: Payer: Self-pay | Admitting: Family Medicine

## 2018-10-12 ENCOUNTER — Encounter: Payer: Self-pay | Admitting: Internal Medicine

## 2018-10-12 ENCOUNTER — Ambulatory Visit: Payer: 59 | Admitting: Internal Medicine

## 2018-10-12 VITALS — BP 120/78 | HR 122 | Ht 68.0 in | Wt 180.0 lb

## 2018-10-12 DIAGNOSIS — I10 Essential (primary) hypertension: Secondary | ICD-10-CM

## 2018-10-12 DIAGNOSIS — K219 Gastro-esophageal reflux disease without esophagitis: Secondary | ICD-10-CM

## 2018-10-12 DIAGNOSIS — D509 Iron deficiency anemia, unspecified: Secondary | ICD-10-CM | POA: Diagnosis not present

## 2018-10-12 MED ORDER — HYDROCHLOROTHIAZIDE 12.5 MG PO CAPS
12.5000 mg | ORAL_CAPSULE | Freq: Every day | ORAL | 0 refills | Status: DC
Start: 2018-10-12 — End: 2018-11-19

## 2018-10-12 MED ORDER — PEG-KCL-NACL-NASULF-NA ASC-C 140 G PO SOLR: 1.0000 | Freq: Once | ORAL | 0 refills | Status: AC

## 2018-10-12 NOTE — Telephone Encounter (Signed)
Please call and find out what her BP has been running at home. She did not follow up as recommended for HTN.  Ok to give her 30 days on HCTZ.

## 2018-10-12 NOTE — Progress Notes (Signed)
HISTORY OF PRESENT ILLNESS:  Anna Zimmerman is a 43 y.o. 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  Patient reports longstanding anemia dating back to her 47s.  She states that she had been on iron previously.  More recently was noted to be significantly anemic on blood work October 2019.  Hemoglobin 7.7 with MCV 66.  Normal white blood cell count and platelets.  She was placed on iron.  Repeat CBC 3 weeks later showed improvement with hemoglobin 10.0.  She is sent to this office for further evaluation.  Patient's GI review of systems is remarkable for belching and regurgitation without pyrosis.  She is not a blood donor.  She has not had menstrual periods for years.  She denies melena or hematochezia.  With her lap band surgery she lost approximately 150 pounds.  No family history of colon cancer.  She did have some problems post lap band with dysphasia for which fluid was withdrawn.  No dysphasia currently.  GI review of systems is otherwise negative.  Review of x-ray file reveals no relevant abnormalities.  Comprehensive metabolic panel is normal  REVIEW OF SYSTEMS:  All non-GI ROS negative unless otherwise stated in the HPI (entirely negative in this case)  Past Medical History:  Diagnosis Date  . Anemia   . Hypertension     Past Surgical History:  Procedure Laterality Date  . LAPAROSCOPIC GASTRIC BANDING    . OVARIAN CYST REMOVAL      Social History Nydia Abate  reports that she has never smoked. She has never used smokeless tobacco. She reports that she does not drink alcohol or use drugs.  family history includes Aneurysm in her father; Breast cancer in an other family member; Heart disease in her maternal grandmother; Hypertension in her father; Kidney disease in her maternal  grandmother.  Allergies  Allergen Reactions  . Azithromycin     rash  . Penicillins     Rash Has patient had a PCN reaction causing immediate rash, facial/tongue/throat swelling, SOB or lightheadedness with hypotension: YES Has patient had a PCN reaction causing severe rash involving mucus membranes or skin necrosis:NO Has patient had a PCN reaction that required hospitalizationNO Has patient had a PCN reaction occurring within the last 10 years: NO If all of the above answers are "NO", then may proceed with Cephalosporin use.       PHYSICAL EXAMINATION: Vital signs: BP 120/78   Pulse (!) 122   Ht 5\' 8"  (1.727 m)   Wt 180 lb (81.6 kg)   BMI 27.37 kg/m   Constitutional: generally well-appearing, no acute distress Psychiatric: alert and oriented x3, cooperative Eyes: extraocular movements intact, anicteric, conjunctiva pink Mouth: oral pharynx moist, no lesions Neck: supple no lymphadenopathy Cardiovascular: heart regular rate and rhythm, no murmur Lungs: clear to auscultation bilaterally Abdomen: soft, nontender, nondistended, no obvious ascites, no peritoneal signs, normal bowel sounds, no organomegaly LAP-BAND port palpable in the left midabdomen Rectal: Deferred until colonoscopy Extremities: no clubbing, cyanosis, or lower extremity edema bilaterally Skin: no lesions on visible extremities Neuro: No focal deficits.  Cranial nerves intact  ASSESSMENT:  1.  Iron deficiency anemia.  Rule out GI mucosal lesion 2.  Status post lap band surgery [place] 2010.  Lost 150 pounds 3.  Dyspeptic symptoms/reflux with regurgitation and belching   PLAN:  1.  Colonoscopy and upper endoscopy with biopsies.The nature of the procedure, as well as the risks, benefits, and alternatives were carefully and thoroughly reviewed with the patient. Ample time for discussion and questions allowed. The patient understood, was satisfied, and agreed to proceed. 2.  Continue iron until 5  to 7 days prior to colonoscopy then hold to assist with preparation.  Likely to resume iron thereafter 3.  Consider small bowel capsule endoscopy if endoscopic evaluation is unrevealing  A copy of this consultation note has been sent to Ms. Liz Claiborne

## 2018-10-12 NOTE — Patient Instructions (Signed)

## 2018-10-12 NOTE — Telephone Encounter (Signed)
Pt called and rescheduled her appt for next week but stated that she would need a refill on her Microzide before her appt on the 23rd

## 2018-10-12 NOTE — Telephone Encounter (Signed)
Pt already has an appt on 23th. We can discuss bp at this time. BP was normal at GI

## 2018-10-15 ENCOUNTER — Ambulatory Visit: Payer: 59 | Admitting: Family Medicine

## 2018-10-17 ENCOUNTER — Other Ambulatory Visit: Payer: Self-pay | Admitting: Family Medicine

## 2018-10-17 DIAGNOSIS — I10 Essential (primary) hypertension: Secondary | ICD-10-CM

## 2018-10-25 ENCOUNTER — Ambulatory Visit (INDEPENDENT_AMBULATORY_CARE_PROVIDER_SITE_OTHER): Payer: 59 | Admitting: Family Medicine

## 2018-10-25 ENCOUNTER — Encounter: Payer: Self-pay | Admitting: Family Medicine

## 2018-10-25 VITALS — BP 122/84 | HR 64 | Wt 177.8 lb

## 2018-10-25 DIAGNOSIS — D509 Iron deficiency anemia, unspecified: Secondary | ICD-10-CM | POA: Diagnosis not present

## 2018-10-25 DIAGNOSIS — I10 Essential (primary) hypertension: Secondary | ICD-10-CM | POA: Diagnosis not present

## 2018-10-25 NOTE — Progress Notes (Signed)
   Subjective:    Patient ID: Anna Zimmerman, female    DOB: [birth year], 43 y.o.   MRN: [id number]  HPI Chief Complaint  Patient presents with  . follwo-up    follow-up on BP.    She is here to follow up on HTN.  Reports taking hydrochlorothiazide and lisinopril daily without any side effects or concerns. BP at home has been in the 120s/60s-70s.  States she stopped drinking soda and has made healthy changes in her diet.  Reports feeling much better  Anemia -states she is having an EGD and colonoscopy on 11/12/2017 Has appt with OB/GYN in early March due to early menopause  Denies fever, chills, night sweats, fatigue, headache, dizziness, chest pain, palpitations, shortness of breath, abdominal pain, N/V/D.   Reports left side of her neck with an enlarged lymph node since having sore throat a couple of weeks ago. Sore throat resolved.   Reviewed allergies, medications, past medical, surgical, family, and social history.   Review of Systems Pertinent positives and negatives in the history of present illness.     Objective:   Physical Exam BP 122/84   Pulse 64   Wt 177 lb 12.8 oz (80.6 kg)   BMI 27.03 kg/m   Alert and in no distress. Tympanic membranes and canals are normal. Pharyngeal area is normal. Neck is supple without adenopathy or thyromegaly. Cardiac exam shows a regular sinus rhythm without murmurs or gallops. Lungs are clear to auscultation. Left lateral neck with a smooth, round, moveable, nontender, superficial mass     Assessment & Plan:  Essential hypertension  Iron deficiency anemia, unspecified iron deficiency anemia type  BP is well controlled on current medication regimen and no side effects. Congratulated her on making healthy lifestyle changes.  Anemia- energy level at baseline per patient. Continue taking iron and will see GI and OB/GYN in the next  6 weeks.  Follow up with me in 4 months for fasting CPE. Recheck left lateral lymph node at that time.  Also will recheck EKG at that appointment.

## 2018-10-30 ENCOUNTER — Encounter: Payer: Self-pay | Admitting: Internal Medicine

## 2018-11-12 ENCOUNTER — Encounter: Payer: Self-pay | Admitting: Internal Medicine

## 2018-11-12 ENCOUNTER — Ambulatory Visit (AMBULATORY_SURGERY_CENTER): Payer: 59 | Admitting: Internal Medicine

## 2018-11-12 VITALS — BP 129/85 | HR 65 | Temp 96.2°F | Resp 11 | Ht 68.0 in | Wt 177.0 lb

## 2018-11-12 DIAGNOSIS — D125 Benign neoplasm of sigmoid colon: Secondary | ICD-10-CM | POA: Diagnosis not present

## 2018-11-12 DIAGNOSIS — D508 Other iron deficiency anemias: Secondary | ICD-10-CM

## 2018-11-12 DIAGNOSIS — K317 Polyp of stomach and duodenum: Secondary | ICD-10-CM

## 2018-11-12 DIAGNOSIS — K219 Gastro-esophageal reflux disease without esophagitis: Secondary | ICD-10-CM

## 2018-11-12 DIAGNOSIS — Z1211 Encounter for screening for malignant neoplasm of colon: Secondary | ICD-10-CM | POA: Diagnosis not present

## 2018-11-12 MED ORDER — SODIUM CHLORIDE 0.9 % IV SOLN: 500.0000 mL | Freq: Once | INTRAVENOUS | Status: DC

## 2018-11-12 MED ORDER — OMEPRAZOLE 20 MG PO CPDR: 20.0000 mg | DELAYED_RELEASE_CAPSULE | Freq: Every day | ORAL | 11 refills | Status: DC

## 2018-11-12 NOTE — Progress Notes (Signed)
To PACU, VSS. Report to [email or web address].tb 

## 2018-11-12 NOTE — Patient Instructions (Signed)
Discharge instructions given. Handout on polyps and esophagitis. Resume previous medications. Prescription sent to pharmacy. YOU HAD AN ENDOSCOPIC PROCEDURE TODAY AT [facility]:   Refer to the procedure report that was given to you for any specific questions about what was found during the examination.  If the procedure report does not answer your questions, please call your gastroenterologist to clarify.  If you requested that your care partner not be given the details of your procedure findings, then the procedure report has been included in a sealed envelope for you to review at your convenience later.  YOU SHOULD EXPECT: Some feelings of bloating in the abdomen. Passage of more gas than usual.  Walking can help get rid of the air that was put into your GI tract during the procedure and reduce the bloating. If you had a lower endoscopy (such as a colonoscopy or flexible sigmoidoscopy) you may notice spotting of blood in your stool or on the toilet paper. If you underwent a bowel prep for your procedure, you may not have a normal bowel movement for a few days.  Please Note:  You might notice some irritation and congestion in your nose or some drainage.  This is from the oxygen used during your procedure.  There is no need for concern and it should clear up in a day or so.  SYMPTOMS TO REPORT IMMEDIATELY:   Following lower endoscopy (colonoscopy or flexible sigmoidoscopy):  Excessive amounts of blood in the stool  Significant tenderness or worsening of abdominal pains  Swelling of the abdomen that is new, acute  Fever of 100F or higher   Following upper endoscopy (EGD)  Vomiting of blood or coffee ground material  New chest pain or pain under the shoulder blades  Painful or persistently difficult swallowing  New shortness of breath  Fever of 100F or higher  Black, tarry-looking stools  For urgent or emergent issues, a gastroenterologist can be reached at any hour by  calling [phone].   DIET:  We do recommend a small meal at first, but then you may proceed to your regular diet.  Drink plenty of fluids but you should avoid alcoholic beverages for 24 hours.  ACTIVITY:  You should plan to take it easy for the rest of today and you should NOT DRIVE or use heavy machinery until tomorrow (because of the sedation medicines used during the test).    FOLLOW UP: Our staff will call the number listed on your records the next business day following your procedure to check on you and address any questions or concerns that you may have regarding the information given to you following your procedure. If we do not reach you, we will leave a message.  However, if you are feeling well and you are not experiencing any problems, there is no need to return our call.  We will assume that you have returned to your regular daily activities without incident.  If any biopsies were taken you will be contacted by phone or by letter within the next 1-3 weeks.  Please call us at [phone] if you have not heard about the biopsies in 3 weeks.    SIGNATURES/CONFIDENTIALITY: You and/or your care partner have signed paperwork which will be entered into your electronic medical record.  These signatures attest to the fact that that the information above on your After Visit Summary has been reviewed and is understood.  Full responsibility of the confidentiality of this discharge information lies with you  and/or your care-partner. 

## 2018-11-12 NOTE — Op Note (Signed)
[facility] Patient Name: Anna Zimmerman Procedure Date: 11/12/2018 10:59 AM MRN: [id number] Endoscopist: Docia Chuck. Henrene Pastor , MD Age: 43 Referring MD:  Date of Birth: 1976/05/21 Gender: Female Account #: [id number] Procedure:                Colonoscopy with cold snare polypectomy x 1 Indications:              Iron deficiency anemia Medicines:                Monitored Anesthesia Care Procedure:                Pre-Anesthesia Assessment:                           - Prior to the procedure, a History and Physical                            was performed, and patient medications and                            allergies were reviewed. The patient's tolerance of                            previous anesthesia was also reviewed. The risks                            and benefits of the procedure and the sedation                            options and risks were discussed with the patient.                            All questions were answered, and informed consent                            was obtained. Prior Anticoagulants: The patient has                            taken no previous anticoagulant or antiplatelet                            agents. After reviewing the risks and benefits, the                            patient was deemed in satisfactory condition to                            undergo the procedure.                           After obtaining informed consent, the colonoscope                            was passed under direct vision. Throughout the  procedure, the patient's blood pressure, pulse, and                            oxygen saturations were monitored continuously. The                            Colonoscope was introduced through the anus and                            advanced to the the cecum, identified by                            appendiceal orifice and ileocecal valve. The                            ileocecal valve, appendiceal  orifice, and rectum                            were photographed. The quality of the bowel                            preparation was excellent. The colonoscopy was                            performed without difficulty. The patient tolerated                            the procedure well. The bowel preparation used was                            SUPREP. Scope In: 11:08:13 AM Scope Out: 11:24:45 AM Scope Withdrawal Time: 0 hours 11 minutes 54 seconds  Total Procedure Duration: 0 hours 16 minutes 32 seconds  Findings:                 A 2 mm polyp was found in the sigmoid colon. The                            polyp was removed with a cold snare. Resection and                            retrieval were complete.                           The exam was otherwise without abnormality on                            direct and retroflexion views. Complications:            No immediate complications. Estimated blood loss:                            None. Estimated Blood Loss:     Estimated blood loss: none. Impression:               - One 2 mm  polyp in the sigmoid colon, removed with                            a cold snare. Resected and retrieved.                           - The examination was otherwise normal on direct                            and retroflexion views. Recommendation:           - Repeat colonoscopy in 5 or 10 years for                            surveillance                           Final pathology.                           - Patient has a contact number available for                            emergencies. The signs and symptoms of potential                            delayed complications were discussed with the                            patient. Return to normal activities tomorrow.                            Written discharge instructions were provided to the                            patient.                           - Resume previous diet.                           -  Continue present medications.                           - Await pathology results.                           - EGD today. Please see report Docia Chuck. Henrene Pastor, MD 11/12/2018 11:29:17 AM This report has been signed electronically.

## 2018-11-12 NOTE — Op Note (Signed)
[facility] Patient Name: Anna Zimmerman Procedure Date: 11/12/2018 10:59 AM MRN: [id number] Endoscopist: Docia Chuck. Henrene Pastor , MD Age: 43 Referring MD:  Date of Birth: 1976/03/31 Gender: Female Account #: [id number] Procedure:                Upper GI endoscopy Indications:              Iron deficiency anemia, Dyspepsia Medicines:                Monitored Anesthesia Care Procedure:                Pre-Anesthesia Assessment:                           - Prior to the procedure, a History and Physical                            was performed, and patient medications and                            allergies were reviewed. The patient's tolerance of                            previous anesthesia was also reviewed. The risks                            and benefits of the procedure and the sedation                            options and risks were discussed with the patient.                            All questions were answered, and informed consent                            was obtained. Prior Anticoagulants: The patient has                            taken no previous anticoagulant or antiplatelet                            agents. ASA Grade Assessment: II - A patient with                            mild systemic disease. After reviewing the risks                            and benefits, the patient was deemed in                            satisfactory condition to undergo the procedure.                           After obtaining informed consent, the endoscope was  passed under direct vision. Throughout the                            procedure, the patient's blood pressure, pulse, and                            oxygen saturations were monitored continuously. The                            Endoscope was introduced through the mouth, and                            advanced to the second part of duodenum. The upper                            GI endoscopy was  accomplished without difficulty.                            The patient tolerated the procedure well. Scope In: Scope Out: Findings:                 The esophagus revealed mild reflux esophagitis as                            manifested by erythema and edema at the mucosal Z                            line.                           The stomach revealed evidence of prior lap band                            procedure without complication. Several diminutive                            benign fundic gland type polyps and several                            diminutive superficial erosions noted. Stomach was                            otherwise normal.                           The examined duodenum was normal. Biopsies for                            histology were taken with a cold forceps for                            evaluation of celiac disease.                           The cardia and gastric fundus were normal on  retroflexion. Complications:            No immediate complications. Estimated Blood Loss:     Estimated blood loss: none. Impression:               - Reflux esophagitis.                           - Status post lap band procedure.                           - Normal examined duodenum. Biopsied. Recommendation:           1. Resume previous diet                           2. Resume iron replacement therapy                           3. Prescribe omeprazole 20 mg daily; #30; 11                            refills. This to treat mild gastric and esophageal                            inflammation                           4. Follow-up with your primary care provider in                            order that your blood counts might be monitored on                            your current iron therapy                           5. Routine GI office follow-up with Dr. Henrene Pastor in 3                            months Docia Chuck. Henrene Pastor, MD 11/12/2018 11:44:17 AM This  report has been signed electronically.

## 2018-11-12 NOTE — Progress Notes (Signed)
Called to room to assist during endoscopic procedure.  Patient ID and intended procedure confirmed with present staff. Received instructions for my participation in the procedure from the performing physician.  

## 2018-11-13 ENCOUNTER — Telehealth: Payer: Self-pay

## 2018-11-13 NOTE — Telephone Encounter (Signed)
  Follow up Call-  Call back number 11/12/2018  Post procedure Call Back phone  # [phone]  Permission to leave phone message Yes  Some recent data might be hidden     Patient questions:  Do you have a fever, pain , or abdominal swelling? No. Pain Score  0 *  Have you tolerated food without any problems? Yes.    Have you been able to return to your normal activities? Yes.    Do you have any questions about your discharge instructions: Diet   No. Medications  No. Follow up visit  No.  Do you have questions or concerns about your Care? No.  Actions: * If pain score is 4 or above: No action needed, pain <4.

## 2018-11-15 ENCOUNTER — Encounter: Payer: Self-pay | Admitting: Internal Medicine

## 2018-11-19 ENCOUNTER — Other Ambulatory Visit: Payer: Self-pay | Admitting: Family Medicine

## 2018-11-19 DIAGNOSIS — I10 Essential (primary) hypertension: Secondary | ICD-10-CM

## 2018-12-04 DIAGNOSIS — Z3202 Encounter for pregnancy test, result negative: Secondary | ICD-10-CM | POA: Diagnosis not present

## 2018-12-04 DIAGNOSIS — N912 Amenorrhea, unspecified: Secondary | ICD-10-CM | POA: Diagnosis not present

## 2018-12-04 DIAGNOSIS — Z6826 Body mass index (BMI) 26.0-26.9, adult: Secondary | ICD-10-CM | POA: Diagnosis not present

## 2018-12-04 DIAGNOSIS — Z1389 Encounter for screening for other disorder: Secondary | ICD-10-CM | POA: Diagnosis not present

## 2018-12-04 DIAGNOSIS — Z01419 Encounter for gynecological examination (general) (routine) without abnormal findings: Secondary | ICD-10-CM | POA: Diagnosis not present

## 2018-12-04 DIAGNOSIS — Z1231 Encounter for screening mammogram for malignant neoplasm of breast: Secondary | ICD-10-CM | POA: Diagnosis not present

## 2018-12-04 DIAGNOSIS — Z124 Encounter for screening for malignant neoplasm of cervix: Secondary | ICD-10-CM | POA: Diagnosis not present

## 2018-12-04 LAB — HM MAMMOGRAPHY

## 2018-12-08 LAB — HM PAP SMEAR: HM Pap smear: NEGATIVE

## 2018-12-17 DIAGNOSIS — N912 Amenorrhea, unspecified: Secondary | ICD-10-CM | POA: Diagnosis not present

## 2018-12-20 ENCOUNTER — Other Ambulatory Visit: Payer: Self-pay | Admitting: Obstetrics and Gynecology

## 2018-12-20 DIAGNOSIS — E2839 Other primary ovarian failure: Secondary | ICD-10-CM

## 2018-12-24 ENCOUNTER — Other Ambulatory Visit: Payer: Self-pay

## 2018-12-24 ENCOUNTER — Encounter: Payer: Self-pay | Admitting: Family Medicine

## 2018-12-24 ENCOUNTER — Ambulatory Visit (INDEPENDENT_AMBULATORY_CARE_PROVIDER_SITE_OTHER): Payer: 59 | Admitting: Family Medicine

## 2018-12-24 VITALS — BP 120/90 | HR 83 | Wt 181.8 lb

## 2018-12-24 DIAGNOSIS — I10 Essential (primary) hypertension: Secondary | ICD-10-CM | POA: Diagnosis not present

## 2018-12-24 DIAGNOSIS — D509 Iron deficiency anemia, unspecified: Secondary | ICD-10-CM | POA: Diagnosis not present

## 2018-12-24 MED ORDER — HYDROCHLOROTHIAZIDE 25 MG PO TABS: 25.0000 mg | ORAL_TABLET | Freq: Every day | ORAL | 2 refills | Status: DC

## 2018-12-24 NOTE — Patient Instructions (Addendum)
I am increasing your HCTZ, the fluid pill, for now to see how your BP responds. Continue on the Lisinopril.    Continue checking your BP at home twice daily for the next couple of weeks and call to message me with your readings.   We will forward your lab results.   Follow up in 4 weeks if your BP is not consistently <130/80 or in 8 weeks if your readings are in normal range.    DASH Eating Plan DASH stands for "Dietary Approaches to Stop Hypertension." The DASH eating plan is a healthy eating plan that has been shown to reduce high blood pressure (hypertension). It may also reduce your risk for type 2 diabetes, heart disease, and stroke. The DASH eating plan may also help with weight loss. What are tips for following this plan?  General guidelines  Avoid eating more than 2,300 mg (milligrams) of salt (sodium) a day. If you have hypertension, you may need to reduce your sodium intake to 1,500 mg a day.  Limit alcohol intake to no more than 1 drink a day for nonpregnant women and 2 drinks a day for men. One drink equals 12 oz of beer, 5 oz of wine, or 1 oz of hard liquor.  Work with your health care provider to maintain a healthy body weight or to lose weight. Ask what an ideal weight is for you.  Get at least 30 minutes of exercise that causes your heart to beat faster (aerobic exercise) most days of the week. Activities may include walking, swimming, or biking.  Work with your health care provider or diet and nutrition specialist (dietitian) to adjust your eating plan to your individual calorie needs. Reading food labels   Check food labels for the amount of sodium per serving. Choose foods with less than 5 percent of the Daily Value of sodium. Generally, foods with less than 300 mg of sodium per serving fit into this eating plan.  To find whole grains, look for the word "whole" as the first word in the ingredient list. Shopping  Buy products labeled as "low-sodium" or "no salt  added."  Buy fresh foods. Avoid canned foods and premade or frozen meals. Cooking  Avoid adding salt when cooking. Use salt-free seasonings or herbs instead of table salt or sea salt. Check with your health care provider or pharmacist before using salt substitutes.  Do not fry foods. Cook foods using healthy methods such as baking, boiling, grilling, and broiling instead.  Cook with heart-healthy oils, such as olive, canola, soybean, or sunflower oil. Meal planning  Eat a balanced diet that includes: ? 5 or more servings of fruits and vegetables each day. At each meal, try to fill half of your plate with fruits and vegetables. ? Up to 6-8 servings of whole grains each day. ? Less than 6 oz of lean meat, poultry, or fish each day. A 3-oz serving of meat is about the same size as a deck of cards. One egg equals 1 oz. ? 2 servings of low-fat dairy each day. ? A serving of nuts, seeds, or beans 5 times each week. ? Heart-healthy fats. Healthy fats called Omega-3 fatty acids are found in foods such as flaxseeds and coldwater fish, like sardines, salmon, and mackerel.  Limit how much you eat of the following: ? Canned or prepackaged foods. ? Food that is high in trans fat, such as fried foods. ? Food that is high in saturated fat, such as fatty meat. ? Sweets,  desserts, sugary drinks, and other foods with added sugar. ? Full-fat dairy products.  Do not salt foods before eating.  Try to eat at least 2 vegetarian meals each week.  Eat more home-cooked food and less restaurant, buffet, and fast food.  When eating at a restaurant, ask that your food be prepared with less salt or no salt, if possible. What foods are recommended? The items listed may not be a complete list. Talk with your dietitian about what dietary choices are best for you. Grains Whole-grain or whole-wheat bread. Whole-grain or whole-wheat pasta. Brown rice. [place] Morrow. Bulgur. Whole-grain and low-sodium cereals.  Pita bread. Low-fat, low-sodium crackers. Whole-wheat flour tortillas. Vegetables Fresh or frozen vegetables (raw, steamed, roasted, or grilled). Low-sodium or reduced-sodium tomato and vegetable juice. Low-sodium or reduced-sodium tomato sauce and tomato paste. Low-sodium or reduced-sodium canned vegetables. Fruits All fresh, dried, or frozen fruit. Canned fruit in natural juice (without added sugar). Meat and other protein foods Skinless chicken or [place]. Ground chicken or [place]. Pork with fat trimmed off. Fish and seafood. Egg whites. Dried beans, peas, or lentils. Unsalted nuts, nut butters, and seeds. Unsalted canned beans. Lean cuts of beef with fat trimmed off. Low-sodium, lean deli meat. Dairy Low-fat (1%) or fat-free (skim) milk. Fat-free, low-fat, or reduced-fat cheeses. Nonfat, low-sodium ricotta or cottage cheese. Low-fat or nonfat yogurt. Low-fat, low-sodium cheese. Fats and oils Soft margarine without trans fats. Vegetable oil. Low-fat, reduced-fat, or light mayonnaise and salad dressings (reduced-sodium). Canola, safflower, olive, soybean, and sunflower oils. Avocado. Seasoning and other foods Herbs. Spices. Seasoning mixes without salt. Unsalted popcorn and pretzels. Fat-free sweets. What foods are not recommended? The items listed may not be a complete list. Talk with your dietitian about what dietary choices are best for you. Grains Baked goods made with fat, such as croissants, muffins, or some breads. Dry pasta or rice meal packs. Vegetables Creamed or fried vegetables. Vegetables in a cheese sauce. Regular canned vegetables (not low-sodium or reduced-sodium). Regular canned tomato sauce and paste (not low-sodium or reduced-sodium). Regular tomato and vegetable juice (not low-sodium or reduced-sodium). Angie Fava. Olives. Fruits Canned fruit in a light or heavy syrup. Fried fruit. Fruit in cream or butter sauce. Meat and other protein foods Fatty cuts of meat. Ribs. Fried  meat. Berniece Salines. Sausage. Bologna and other processed lunch meats. Salami. Fatback. Hotdogs. Bratwurst. Salted nuts and seeds. Canned beans with added salt. Canned or smoked fish. Whole eggs or egg yolks. Chicken or [place] with skin. Dairy Whole or 2% milk, cream, and half-and-half. Whole or full-fat cream cheese. Whole-fat or sweetened yogurt. Full-fat cheese. Nondairy creamers. Whipped toppings. Processed cheese and cheese spreads. Fats and oils Butter. Stick margarine. Lard. Shortening. Ghee. Bacon fat. Tropical oils, such as coconut, palm kernel, or palm oil. Seasoning and other foods Salted popcorn and pretzels. Onion salt, garlic salt, seasoned salt, table salt, and sea salt. Worcestershire sauce. Tartar sauce. Barbecue sauce. Teriyaki sauce. Soy sauce, including reduced-sodium. Steak sauce. Canned and packaged gravies. Fish sauce. Oyster sauce. Cocktail sauce. Horseradish that you find on the shelf. Ketchup. Mustard. Meat flavorings and tenderizers. Bouillon cubes. Hot sauce and Tabasco sauce. Premade or packaged marinades. Premade or packaged taco seasonings. Relishes. Regular salad dressings. Where to find more information:  National Heart, Lung, and [place]: https://wilson-eaton.com/  American Heart Association: www.heart.org Summary  The DASH eating plan is a healthy eating plan that has been shown to reduce high blood pressure (hypertension). It may also reduce your risk for type 2 diabetes, heart  disease, and stroke.  With the DASH eating plan, you should limit salt (sodium) intake to 2,300 mg a day. If you have hypertension, you may need to reduce your sodium intake to 1,500 mg a day.  When on the DASH eating plan, aim to eat more fresh fruits and vegetables, whole grains, lean proteins, low-fat dairy, and heart-healthy fats.  Work with your health care provider or diet and nutrition specialist (dietitian) to adjust your eating plan to your individual calorie needs. This information is  not intended to replace advice given to you by your health care provider. Make sure you discuss any questions you have with your health care provider. Document Released: 09/08/2011 Document Revised: 09/12/2016 Document Reviewed: 09/12/2016 Elsevier Interactive Patient Education  2019 [employer].

## 2018-12-24 NOTE — Progress Notes (Signed)
   Subjective:    Patient ID: Letitia Neri, female    DOB: 04-Feb-1976, 43 y.o.   MRN: [id number]  HPI Chief Complaint  Patient presents with  . BP elevated    bp elevated  last week at dentist- she was getting tooth pulled and bottom number was in 100s   She is here to follow up on recently elevated BP at her dentist office and at home.  Reports taking BP daily and readings have been in the 130s/90s or higher.    Diet has not changed but is drinking more soda.   Denies fever, chills, dizziness, chest pain, palpitations, shortness of breath, abdominal pain, N/V/D, urinary symptoms, LE edema.   States she accidentally scratched her left lower eye lid. No pain or drainage.   LMP: early menopause and being managed by her OB/GYN   Anemia- taking iron. Feels good. Energy level is up.   Review of Systems Pertinent positives and negatives in the history of present illness.     Objective:   Physical Exam BP 120/90   Pulse 83   Wt 181 lb 12.8 oz (82.5 kg)   BMI 27.64 kg/m   Alert and oriented and in no acute distress. Cardiac exam shows a regular sinus rhythm without murmurs or gallops. Lungs are clear to auscultation. Extremities without edema. Skin is warm and dry, no pallor. Left lower eye lid with a superficial scratch, no sign of infection. Normal conjunctiva.       Assessment & Plan:  Essential hypertension - Plan: CBC with Differential/Platelet, Comprehensive metabolic panel, hydrochlorothiazide (HYDRODIURIL) 25 MG tablet  Iron deficiency anemia, unspecified iron deficiency anemia type - Plan: CBC with Differential/Platelet, Iron, TIBC and Ferritin Panel  Her BP today is somewhat elevated. She is concerned due to consistently elevated readings at home. Is in favor of increasing medication dose. Will continue on lisinopril 40 and increase HCTZ to 25 mg. Discussed potential side effects including depletion of her potassium level so we will check labs.  Anemia- taking iron  daily and is asymptomatic. Check iron studies.  Asked her to call me in 2 weeks with readings.  Follow up with me in 4 weeks if BP not in goal range or in 8 weeks.

## 2018-12-25 LAB — CBC WITH DIFFERENTIAL/PLATELET
Basophils Absolute: 0 10*3/uL (ref 0.0–0.2)
Basos: 1 %
EOS (ABSOLUTE): 0 10*3/uL (ref 0.0–0.4)
Eos: 1 %
Hematocrit: 34.2 % (ref 34.0–46.6)
Hemoglobin: 11.6 g/dL (ref 11.1–15.9)
Immature Grans (Abs): 0 10*3/uL (ref 0.0–0.1)
Immature Granulocytes: 0 %
Lymphocytes Absolute: 1.2 10*3/uL (ref 0.7–3.1)
Lymphs: 22 %
MCH: 27.2 pg (ref 26.6–33.0)
MCHC: 33.9 g/dL (ref 31.5–35.7)
MCV: 80 fL (ref 79–97)
Monocytes Absolute: 0.3 10*3/uL (ref 0.1–0.9)
Monocytes: 6 %
Neutrophils Absolute: 3.9 10*3/uL (ref 1.4–7.0)
Neutrophils: 70 %
Platelets: 271 10*3/uL (ref 150–450)
RBC: 4.26 x10E6/uL (ref 3.77–5.28)
RDW: 14.6 % (ref 11.7–15.4)
WBC: 5.5 10*3/uL (ref 3.4–10.8)

## 2018-12-25 LAB — COMPREHENSIVE METABOLIC PANEL
ALT: 12 IU/L (ref 0–32)
AST: 12 IU/L (ref 0–40)
Albumin/Globulin Ratio: 1.2 (ref 1.2–2.2)
Albumin: 4.1 g/dL (ref 3.8–4.8)
Alkaline Phosphatase: 66 IU/L (ref 39–117)
BUN/Creatinine Ratio: 23 (ref 9–23)
BUN: 20 mg/dL (ref 6–24)
Bilirubin Total: 1 mg/dL (ref 0.0–1.2)
CO2: 23 mmol/L (ref 20–29)
Calcium: 9.5 mg/dL (ref 8.7–10.2)
Chloride: 107 mmol/L — ABNORMAL HIGH (ref 96–106)
Creatinine, Ser: 0.87 mg/dL (ref 0.57–1.00)
GFR calc Af Amer: 96 mL/min/{1.73_m2} (ref >59)
GFR calc non Af Amer: 83 mL/min/{1.73_m2} (ref >59)
Globulin, Total: 3.4 g/dL (ref 1.5–4.5)
Glucose: 89 mg/dL (ref 65–99)
Potassium: 3.8 mmol/L (ref 3.5–5.2)
Sodium: 145 mmol/L — ABNORMAL HIGH (ref 134–144)
Total Protein: 7.5 g/dL (ref 6.0–8.5)

## 2018-12-25 LAB — IRON,TIBC AND FERRITIN PANEL
Ferritin: 32 ng/mL (ref 15–150)
Iron Saturation: 13 % — ABNORMAL LOW (ref 15–55)
Iron: 42 ug/dL (ref 27–159)
Total Iron Binding Capacity: 317 ug/dL (ref 250–450)
UIBC: 275 ug/dL (ref 131–425)

## 2019-01-20 ENCOUNTER — Other Ambulatory Visit: Payer: Self-pay | Admitting: Family Medicine

## 2019-01-20 DIAGNOSIS — I10 Essential (primary) hypertension: Secondary | ICD-10-CM

## 2019-02-04 ENCOUNTER — Ambulatory Visit: Payer: 59 | Admitting: Family Medicine

## 2019-02-06 ENCOUNTER — Encounter: Payer: Self-pay | Admitting: Internal Medicine

## 2019-02-21 ENCOUNTER — Other Ambulatory Visit: Payer: 59

## 2019-02-22 ENCOUNTER — Other Ambulatory Visit: Payer: Self-pay | Admitting: Family Medicine

## 2019-02-22 DIAGNOSIS — D649 Anemia, unspecified: Secondary | ICD-10-CM

## 2019-02-22 NOTE — Telephone Encounter (Signed)
Left message on voicemail for patient to call back. 

## 2019-02-26 NOTE — Telephone Encounter (Signed)
Pt has an appt tomorrow °

## 2019-02-27 ENCOUNTER — Ambulatory Visit: Payer: 59 | Admitting: Family Medicine

## 2019-02-27 ENCOUNTER — Encounter: Payer: Self-pay | Admitting: Family Medicine

## 2019-02-27 ENCOUNTER — Other Ambulatory Visit: Payer: Self-pay

## 2019-02-27 VITALS — BP 122/82 | HR 60 | Ht 69.0 in | Wt 178.4 lb

## 2019-02-27 DIAGNOSIS — I10 Essential (primary) hypertension: Secondary | ICD-10-CM | POA: Diagnosis not present

## 2019-02-27 DIAGNOSIS — Z23 Encounter for immunization: Secondary | ICD-10-CM

## 2019-02-27 DIAGNOSIS — D509 Iron deficiency anemia, unspecified: Secondary | ICD-10-CM

## 2019-02-27 DIAGNOSIS — E28319 Asymptomatic premature menopause: Secondary | ICD-10-CM | POA: Insufficient documentation

## 2019-02-27 DIAGNOSIS — R9431 Abnormal electrocardiogram [ECG] [EKG]: Secondary | ICD-10-CM | POA: Diagnosis not present

## 2019-02-27 DIAGNOSIS — Z0000 Encounter for general adult medical examination without abnormal findings: Secondary | ICD-10-CM

## 2019-02-27 LAB — LIPID PANEL

## 2019-02-27 MED ORDER — LISINOPRIL 40 MG PO TABS: ORAL_TABLET | ORAL | 5 refills | Status: DC

## 2019-02-27 MED ORDER — HYDROCHLOROTHIAZIDE 25 MG PO TABS: 25.0000 mg | ORAL_TABLET | Freq: Every day | ORAL | 5 refills | Status: DC

## 2019-02-27 NOTE — Progress Notes (Signed)
Subjective:    Patient ID: Anna Zimmerman, female    DOB: 01/15/1976, 43 y.o.   MRN: [id number]  HPI Chief Complaint  Patient presents with  . fasting cpe    fasting cpe, sees obgyn. gets eyes checked yearly   She is new to the practice and here for a complete physical exam and follow up on chronic health conditions.  Previous medical care: moved here from [place] in 2015  Last CPE: years ago   HTN-previously uncontrolled.  Reports taking lisinopril and HCTZ daily without any issues.  We increased her dose of HCTZ in March.   States she is taking apple cider vinegar gummies for acid reflux. Taking omeprazole daily.   Early menopause -diagnosed by OB/GYN  IDA-taking iron.  Managed by GI  Other providers: OB/GYN-[facility] Dr. Kara Mead [facility]- Dr. Henrene Pastor   Needs repeat ECG due to history of strain pattern when she first established care in 07/2018 and her BP was uncontrolled.    Social history: Lives with daughter and son, works at a group home  Denies smoking, drinking alcohol, drug use  Diet: cut out soda. Eating healthier  Excerise: 2 times per week she walks.   Immunizations: Tdap unknown.   Health maintenance:  Mammogram: March 2020  Colonoscopy: 11/12/18 Last Gynecological Exam: March 2020  Last Menstrual cycle: menopausal  Last Dental Exam: in the past few months  Last Eye Exam: years ago   Wears seatbelt always, smoke detectors in home and functioning, does not text while driving and feels safe in home environment.   Reviewed allergies, medications, past medical, surgical, family, and social history.    Review of Systems Review of Systems Constitutional: -fever, -chills, -sweats, -unexpected weight change,-fatigue ENT: -runny nose, -ear pain, -sore throat Cardiology:  -chest pain, -palpitations, -edema Respiratory: -cough, -shortness of breath, -wheezing Gastroenterology: -abdominal pain, -nausea, -vomiting, -diarrhea, -constipation  Hematology:  -bleeding or bruising problems Musculoskeletal: -arthralgias, -myalgias, -joint swelling, -back pain Ophthalmology: -vision changes Urology: -dysuria, -difficulty urinating, -hematuria, -urinary frequency, -urgency Neurology: -headache, -weakness, -tingling, -numbness       Objective:   Physical Exam BP 122/82   Pulse 60   Ht 5\' 9"  (1.753 m)   Wt 178 lb 6.4 oz (80.9 kg)   BMI 26.35 kg/m   General Appearance:    Alert, cooperative, no distress, appears stated age  Head:    Normocephalic, without obvious abnormality, atraumatic  Eyes:    PERRL, conjunctiva/corneas clear, EOM's intact, fundi    benign  Ears:    Normal TM's and external ear canals  Nose:   Nares normal, mucosa normal, no drainage or sinus   tenderness  Throat:   Lips, mucosa, and tongue normal; teeth and gums normal  Neck:   Supple, no lymphadenopathy;  thyroid:  no   enlargement/tenderness/nodules; no carotid   bruit or JVD  Back:    Spine nontender, no curvature, ROM normal, no CVA     tenderness  Lungs:     Clear to auscultation bilaterally without wheezes, rales or     ronchi; respirations unlabored  Chest Wall:    No tenderness    Heart:    Regular rate and rhythm, S1 and S2 normal, no murmur, rub   or gallop  Breast Exam:   OB/GYN  Abdomen:     Soft, non-tender, nondistended, normoactive bowel sounds,    no masses, no hepatosplenomegaly  Genitalia:   OB/GYN     Extremities:   No clubbing, cyanosis or edema  Pulses:   2+ and symmetric all extremities  Skin:   Skin color, texture, turgor normal, no rashes or lesions  Lymph nodes:   Cervical, supraclavicular, and axillary nodes normal  Neurologic:   CNII-XII intact, normal strength, sensation and gait; reflexes 2+ and symmetric throughout          Psych:   Normal mood, affect, hygiene and grooming.         Assessment & Plan:  Routine general medical examination at a health care facility - Plan: CBC with Differential/Platelet, Comprehensive metabolic  panel, TSH, Lipid panel  Essential hypertension - Plan: EKG 12-Lead, lisinopril (ZESTRIL) 40 MG tablet, hydrochlorothiazide (HYDRODIURIL) 25 MG tablet  Early menopause  Abnormal ECG - Plan: EKG 12-Lead  Need for diphtheria-tetanus-pertussis (Tdap) vaccine - Plan: Tdap vaccine greater than or equal to 7yo IM  Iron deficiency anemia, unspecified iron deficiency anemia type  She is here for a fasting CPE and to follow-up on previously uncontrolled hypertension. She appears to be doing well physically and emotionally. Her blood pressure is now much closer to goal range since increasing her dose of HCTZ.  She will continue on her current medication regimen.  She does report doing well on this. We will need to recheck a potassium level since increasing Dyazide diuretic. Counseling on low-sodium diet and increasing physical activity. IDA managed by GI.  She will continue on iron. Tdap given.  Counseling done on all components of the vaccine. She has an OB/GYN and is up-to-date on mammogram and Pap smear.  I will request records. Follow-up in 6 months for hypertension or sooner if needed.

## 2019-02-27 NOTE — Patient Instructions (Addendum)
Your BP today is much better at 122/82. Continue on your current medications and with a low sodium diet.   We will call you with your lab results.     Preventive Care 40-64 Years, Female Preventive care refers to lifestyle choices and visits with your health care provider that can promote health and wellness. What does preventive care include?   A yearly physical exam. This is also called an annual well check.  Dental exams once or twice a year.  Routine eye exams. Ask your health care provider how often you should have your eyes checked.  Personal lifestyle choices, including: ? Daily care of your teeth and gums. ? Regular physical activity. ? Eating a healthy diet. ? Avoiding tobacco and drug use. ? Limiting alcohol use. ? Practicing safe sex. ? Taking low-dose aspirin daily starting at age 2. ? Taking vitamin and mineral supplements as recommended by your health care provider. What happens during an annual well check? The services and screenings done by your health care provider during your annual well check will depend on your age, overall health, lifestyle risk factors, and family history of disease. Counseling Your health care provider may ask you questions about your:  Alcohol use.  Tobacco use.  Drug use.  Emotional well-being.  Home and relationship well-being.  Sexual activity.  Eating habits.  Work and work Statistician.  Method of birth control.  Menstrual cycle.  Pregnancy history. Screening You may have the following tests or measurements:  Height, weight, and BMI.  Blood pressure.  Lipid and cholesterol levels. These may be checked every 5 years, or more frequently if you are over 28 years old.  Skin check.  Lung cancer screening. You may have this screening every year starting at age 83 if you have a 30-pack-year history of smoking and currently smoke or have quit within the past 15 years.  Colorectal cancer screening. All adults should  have this screening starting at age 36 and continuing until age 80. Your health care provider may recommend screening at age 61. You will have tests every 1-10 years, depending on your results and the type of screening test. People at increased risk should start screening at an earlier age. Screening tests may include: ? Guaiac-based fecal occult blood testing. ? Fecal immunochemical test (FIT). ? Stool DNA test. ? Virtual colonoscopy. ? Sigmoidoscopy. During this test, a flexible tube with a tiny camera (sigmoidoscope) is used to examine your rectum and lower colon. The sigmoidoscope is inserted through your anus into your rectum and lower colon. ? Colonoscopy. During this test, a long, thin, flexible tube with a tiny camera (colonoscope) is used to examine your entire colon and rectum.  Hepatitis C blood test.  Hepatitis B blood test.  Sexually transmitted disease (STD) testing.  Diabetes screening. This is done by checking your blood sugar (glucose) after you have not eaten for a while (fasting). You may have this done every 1-3 years.  Mammogram. This may be done every 1-2 years. Talk to your health care provider about when you should start having regular mammograms. This may depend on whether you have a family history of breast cancer.  BRCA-related cancer screening. This may be done if you have a family history of breast, ovarian, tubal, or peritoneal cancers.  Pelvic exam and Pap test. This may be done every 3 years starting at age 10. Starting at age 7, this may be done every 5 years if you have a Pap test in combination with  an HPV test.  Bone density scan. This is done to screen for osteoporosis. You may have this scan if you are at high risk for osteoporosis. Discuss your test results, treatment options, and if necessary, the need for more tests with your health care provider. Vaccines Your health care provider may recommend certain vaccines, such as:  Influenza vaccine. This  is recommended every year.  Tetanus, diphtheria, and acellular pertussis (Tdap, Td) vaccine. You may need a Td booster every 10 years.  Varicella vaccine. You may need this if you have not been vaccinated.  Zoster vaccine. You may need this after age 65.  Measles, mumps, and rubella (MMR) vaccine. You may need at least one dose of MMR if you were born in 1957 or later. You may also need a second dose.  Pneumococcal 13-valent conjugate (PCV13) vaccine. You may need this if you have certain conditions and were not previously vaccinated.  Pneumococcal polysaccharide (PPSV23) vaccine. You may need one or two doses if you smoke cigarettes or if you have certain conditions.  Meningococcal vaccine. You may need this if you have certain conditions.  Hepatitis A vaccine. You may need this if you have certain conditions or if you travel or work in places where you may be exposed to hepatitis A.  Hepatitis B vaccine. You may need this if you have certain conditions or if you travel or work in places where you may be exposed to hepatitis B.  Haemophilus influenzae type b (Hib) vaccine. You may need this if you have certain conditions. Talk to your health care provider about which screenings and vaccines you need and how often you need them. This information is not intended to replace advice given to you by your health care provider. Make sure you discuss any questions you have with your health care provider. Document Released: 10/16/2015 Document Revised: 11/09/2017 Document Reviewed: 07/21/2015 Elsevier Interactive Patient Education  2019 [employer].

## 2019-02-28 ENCOUNTER — Other Ambulatory Visit: Payer: Self-pay

## 2019-02-28 DIAGNOSIS — K219 Gastro-esophageal reflux disease without esophagitis: Secondary | ICD-10-CM

## 2019-02-28 DIAGNOSIS — I10 Essential (primary) hypertension: Secondary | ICD-10-CM

## 2019-02-28 DIAGNOSIS — D508 Other iron deficiency anemias: Secondary | ICD-10-CM

## 2019-02-28 DIAGNOSIS — D649 Anemia, unspecified: Secondary | ICD-10-CM

## 2019-02-28 LAB — LIPID PANEL
Chol/HDL Ratio: 2 ratio (ref 0.0–4.4)
Cholesterol, Total: 129 mg/dL (ref 100–199)
HDL: 66 mg/dL (ref >39)
LDL Calculated: 52 mg/dL (ref 0–99)
Triglycerides: 54 mg/dL (ref 0–149)
VLDL Cholesterol Cal: 11 mg/dL (ref 5–40)

## 2019-02-28 LAB — CBC WITH DIFFERENTIAL/PLATELET
Basophils Absolute: 0 10*3/uL (ref 0.0–0.2)
Basos: 1 %
EOS (ABSOLUTE): 0 10*3/uL (ref 0.0–0.4)
Eos: 1 %
Hematocrit: 34.8 % (ref 34.0–46.6)
Hemoglobin: 11.8 g/dL (ref 11.1–15.9)
Immature Grans (Abs): 0 10*3/uL (ref 0.0–0.1)
Immature Granulocytes: 0 %
Lymphocytes Absolute: 1.4 10*3/uL (ref 0.7–3.1)
Lymphs: 38 %
MCH: 27.4 pg (ref 26.6–33.0)
MCHC: 33.9 g/dL (ref 31.5–35.7)
MCV: 81 fL (ref 79–97)
Monocytes Absolute: 0.3 10*3/uL (ref 0.1–0.9)
Monocytes: 8 %
Neutrophils Absolute: 1.9 10*3/uL (ref 1.4–7.0)
Neutrophils: 52 %
Platelets: 259 10*3/uL (ref 150–450)
RBC: 4.3 x10E6/uL (ref 3.77–5.28)
RDW: 14.3 % (ref 11.7–15.4)
WBC: 3.7 10*3/uL (ref 3.4–10.8)

## 2019-02-28 LAB — COMPREHENSIVE METABOLIC PANEL
ALT: 7 IU/L (ref 0–32)
AST: 12 IU/L (ref 0–40)
Albumin/Globulin Ratio: 1.4 (ref 1.2–2.2)
Albumin: 4.4 g/dL (ref 3.8–4.8)
Alkaline Phosphatase: 59 IU/L (ref 39–117)
BUN/Creatinine Ratio: 23 (ref 9–23)
BUN: 23 mg/dL (ref 6–24)
Bilirubin Total: 0.7 mg/dL (ref 0.0–1.2)
CO2: 23 mmol/L (ref 20–29)
Calcium: 9.8 mg/dL (ref 8.7–10.2)
Chloride: 102 mmol/L (ref 96–106)
Creatinine, Ser: 0.98 mg/dL (ref 0.57–1.00)
GFR calc Af Amer: 82 mL/min/{1.73_m2} (ref >59)
GFR calc non Af Amer: 71 mL/min/{1.73_m2} (ref >59)
Globulin, Total: 3.1 g/dL (ref 1.5–4.5)
Glucose: 97 mg/dL (ref 65–99)
Potassium: 4.3 mmol/L (ref 3.5–5.2)
Sodium: 142 mmol/L (ref 134–144)
Total Protein: 7.5 g/dL (ref 6.0–8.5)

## 2019-02-28 LAB — TSH: TSH: 1.39 u[IU]/mL (ref 0.450–4.500)

## 2019-02-28 MED ORDER — FERROUS GLUCONATE 324 (38 FE) MG PO TABS: 324.0000 mg | ORAL_TABLET | Freq: Three times a day (TID) | ORAL | 3 refills | Status: DC

## 2019-02-28 MED ORDER — HYDROCHLOROTHIAZIDE 25 MG PO TABS: 25.0000 mg | ORAL_TABLET | Freq: Every day | ORAL | 5 refills | Status: DC

## 2019-02-28 MED ORDER — OMEPRAZOLE 20 MG PO CPDR: 20.0000 mg | DELAYED_RELEASE_CAPSULE | Freq: Every day | ORAL | 11 refills | Status: DC

## 2019-02-28 MED ORDER — LISINOPRIL 40 MG PO TABS: ORAL_TABLET | ORAL | 5 refills | Status: DC

## 2019-02-28 MED ORDER — DOCUSATE SODIUM 100 MG PO CAPS: 100.0000 mg | ORAL_CAPSULE | Freq: Two times a day (BID) | ORAL | 0 refills | Status: DC

## 2019-03-25 ENCOUNTER — Other Ambulatory Visit: Payer: Self-pay

## 2019-03-25 ENCOUNTER — Telehealth: Payer: Self-pay | Admitting: Internal Medicine

## 2019-03-25 ENCOUNTER — Emergency Department (HOSPITAL_COMMUNITY)
Admission: EM | Admit: 2019-03-25 | Discharge: 2019-03-25 | Disposition: A | Discharge status: Home / Self Care | Payer: 59 | Attending: Emergency Medicine | Admitting: Emergency Medicine

## 2019-03-25 ENCOUNTER — Encounter (HOSPITAL_COMMUNITY): Payer: Self-pay | Admitting: Emergency Medicine

## 2019-03-25 ENCOUNTER — Emergency Department (HOSPITAL_COMMUNITY): Payer: 59

## 2019-03-25 DIAGNOSIS — Z79899 Other long term (current) drug therapy: Secondary | ICD-10-CM | POA: Diagnosis not present

## 2019-03-25 DIAGNOSIS — R0789 Other chest pain: Secondary | ICD-10-CM | POA: Diagnosis not present

## 2019-03-25 DIAGNOSIS — I10 Essential (primary) hypertension: Secondary | ICD-10-CM | POA: Diagnosis not present

## 2019-03-25 LAB — CBC
HCT: 34.3 % — ABNORMAL LOW (ref 36.0–46.0)
Hemoglobin: 10.9 g/dL — ABNORMAL LOW (ref 12.0–15.0)
MCH: 27.2 pg (ref 26.0–34.0)
MCHC: 31.8 g/dL (ref 30.0–36.0)
MCV: 85.5 fL (ref 80.0–100.0)
Platelets: 217 10*3/uL (ref 150–400)
RBC: 4.01 MIL/uL (ref 3.87–5.11)
RDW: 15.2 % (ref 11.5–15.5)
WBC: 4.2 10*3/uL (ref 4.0–10.5)
nRBC: 0 % (ref 0.0–0.2)

## 2019-03-25 LAB — BASIC METABOLIC PANEL
Anion gap: 11 (ref 5–15)
BUN: 23 mg/dL — ABNORMAL HIGH (ref 6–20)
CO2: 25 mmol/L (ref 22–32)
Calcium: 9.3 mg/dL (ref 8.9–10.3)
Chloride: 104 mmol/L (ref 98–111)
Creatinine, Ser: 0.99 mg/dL (ref 0.44–1.00)
GFR calc Af Amer: >60 mL/min (ref >60)
GFR calc non Af Amer: >60 mL/min (ref >60)
Glucose, Bld: 128 mg/dL — ABNORMAL HIGH (ref 70–99)
Potassium: 3.3 mmol/L — ABNORMAL LOW (ref 3.5–5.1)
Sodium: 140 mmol/L (ref 135–145)

## 2019-03-25 LAB — TROPONIN I
Troponin I: <0.03 ng/mL (ref <0.03)
Troponin I: <0.03 ng/mL (ref <0.03)

## 2019-03-25 MED ORDER — SODIUM CHLORIDE 0.9% FLUSH: 3.0000 mL | Freq: Once | INTRAVENOUS | Status: DC

## 2019-03-25 NOTE — ED Notes (Signed)
Patient transported to X-ray 

## 2019-03-25 NOTE — Discharge Instructions (Addendum)
Take over-the-counter medications as needed for pain, follow-up with your primary care doctor for further evaluation as we discussed, return as needed for worsening symptoms

## 2019-03-25 NOTE — ED Triage Notes (Signed)
Pt complaint of interm left chest pain with tingling down left arm since yesterday am.

## 2019-03-25 NOTE — ED Provider Notes (Signed)
[facility] DEPT Provider Note   CSN: [id number] Arrival date & time: 03/25/19  1502    History   Chief Complaint Chief Complaint  Patient presents with  . Chest Pain    HPI Anna Zimmerman is a 43 y.o. female.  HPI: A 43 year old patient with a history of hypertension presents for evaluation of chest pain. Initial onset of pain was more than 6 hours ago. The patient's chest pain is not worse with exertion. The patient reports some diaphoresis. The patient's chest pain is middle- or left-sided, is not well-localized, is not described as heaviness/pressure/tightness, is not sharp and does radiate to the arms/jaw/neck. The patient does not complain of nausea. The patient has no history of stroke, has no history of peripheral artery disease, has not smoked in the past 90 days, denies any history of treated diabetes, has no relevant family history of coronary artery disease (first degree relative at less than age 42), has no history of hypercholesterolemia and does not have an elevated BMI (>=30).   Sx started yesterday.  It is an aching in her chest that comes and goes.  She has noticed tingling in her left arm as well.  She noticed it a work bu did get better with deep breathing. HPI  Past Medical History:  Diagnosis Date  . Anemia   . Early menopause   . Hypertension     Patient Active Problem List   Diagnosis Date Noted  . Early menopause   . Anemia 07/06/2018  . Hypertension 07/05/2018  . Amenorrhea 07/05/2018    Past Surgical History:  Procedure Laterality Date  . LAPAROSCOPIC GASTRIC BANDING    . OVARIAN CYST REMOVAL    . right oopherectomy       OB History   No obstetric history on file.      Home Medications    Prior to Admission medications   Medication Sig Start Date End Date Taking? Authorizing Provider  docusate sodium (COLACE) 100 MG capsule Take 1 capsule (100 mg total) by mouth 2 (two) times daily. 02/28/19  Yes Henson,  Vickie L, NP-C  ferrous gluconate (FERGON) 324 MG tablet Take 1 tablet (324 mg total) by mouth 3 (three) times daily with meals. 02/28/19  Yes Henson, Vickie L, NP-C  hydrochlorothiazide (HYDRODIURIL) 25 MG tablet Take 1 tablet (25 mg total) by mouth daily. 02/28/19  Yes Henson, Vickie L, NP-C  ibuprofen (ADVIL) 200 MG tablet Take 400 mg by mouth 2 (two) times daily as needed (ear pain).   Yes [provider]  lisinopril (ZESTRIL) 40 MG tablet TAKE 1 TABLET(40 MG) BY MOUTH DAILY Patient taking differently: Take 40 mg by mouth daily.  02/28/19  Yes Henson, Vickie L, NP-C  omeprazole (PRILOSEC) 20 MG capsule Take 1 capsule (20 mg total) by mouth daily. 02/28/19  Yes Henson, Vickie L, NP-C    Family History Family History  Problem Relation Age of Onset  . Hypertension Father   . Aneurysm Father   . Heart disease Maternal Grandmother   . Kidney disease Maternal Grandmother   . Breast cancer Other        great grandmother  . Colon cancer Neg Hx   . Stomach cancer Neg Hx   . Pancreatic cancer Neg Hx     Social History Social History   Tobacco Use  . Smoking status: Never Smoker  . Smokeless tobacco: Never Used  Substance Use Topics  . Alcohol use: No  . Drug use: No  Allergies   Azithromycin and Penicillins   Review of Systems Review of Systems  All other systems reviewed and are negative.    Physical Exam Updated Vital Signs BP 108/80   Pulse 70   Temp 98.3 F (36.8 C) (Oral)   Resp 15   Ht 1.753 m (5\' 9" )   Wt 77.1 kg   LMP 12/16/2014 (Approximate)   SpO2 100%   BMI 25.10 kg/m   Physical Exam Vitals signs and nursing note reviewed.  Constitutional:      General: She is not in acute distress.    Appearance: She is well-developed.  HENT:     Head: Normocephalic and atraumatic.     Right Ear: External ear normal.     Left Ear: External ear normal.  Eyes:     General: No scleral icterus.       Right eye: No discharge.        Left eye: No  discharge.     Conjunctiva/sclera: Conjunctivae normal.  Neck:     Musculoskeletal: Neck supple.     Trachea: No tracheal deviation.  Cardiovascular:     Rate and Rhythm: Normal rate and regular rhythm.  Pulmonary:     Effort: Pulmonary effort is normal. No respiratory distress.     Breath sounds: Normal breath sounds. No stridor. No wheezing or rales.  Abdominal:     General: Bowel sounds are normal. There is no distension.     Palpations: Abdomen is soft.     Tenderness: There is no abdominal tenderness. There is no guarding or rebound.  Musculoskeletal:        General: No tenderness.  Skin:    General: Skin is warm and dry.     Findings: No rash.  Neurological:     Mental Status: She is alert.     Cranial Nerves: No cranial nerve deficit (no facial droop, extraocular movements intact, no slurred speech).     Sensory: No sensory deficit.     Motor: No abnormal muscle tone or seizure activity.     Coordination: Coordination normal.      ED Treatments / Results  Labs (all labs ordered are listed, but only abnormal results are displayed) Labs Reviewed  BASIC METABOLIC PANEL - Abnormal; Notable for the following components:      Result Value   Potassium 3.3 (*)    Glucose, Bld 128 (*)    BUN 23 (*)    All other components within normal limits  CBC - Abnormal; Notable for the following components:   Hemoglobin 10.9 (*)    HCT 34.3 (*)    All other components within normal limits  TROPONIN I  TROPONIN I    EKG EKG Interpretation  Date/Time:  Monday March 25 2019 15:11:47 EDT Ventricular Rate:  82 PR Interval:    QRS Duration: 83 QT Interval:  426 QTC Calculation: 498 R Axis:   74 Text Interpretation:  Sinus rhythm Multiple premature complexes, vent & supraven Nonspecific T abnrm, anterolateral leads , new since last tracing Borderline prolonged QT interval Confirmed by Dorie Rank [phone]) on 03/25/2019 3:32:33 PM   Radiology Dg Chest 2 View  Result Date:  03/25/2019 CLINICAL DATA:  Intermittent left chest pain with left arm tingling since yesterday. EXAM: CHEST - 2 VIEW COMPARISON:  Radiographs 07/30/2016 and 05/08/2015. FINDINGS: The heart size and mediastinal contours are normal. The lungs are clear. There is no pleural effusion or pneumothorax. No acute osseous findings are identified. Gastric lap band  orientation appears unchanged. Telemetry leads overlie the chest. IMPRESSION: Stable chest.  No active cardiopulmonary process. Electronically Signed   By: Richardean Sale M.D.   On: 03/25/2019 16:01    Procedures Procedures (including critical care time)  Medications Ordered in ED Medications - No data to display   Initial Impression / Assessment and Plan / ED Course  I have reviewed the triage vital signs and the nursing notes.  Pertinent labs & imaging results that were available during my care of the patient were reviewed by me and considered in my medical decision making (see chart for details).   Patient presented to the emergency room for evaluation of chest pain.  Patient is overall low risk for heart disease.  Heart score of 3.  Serial troponins are negative.  I doubt acute coronary syndrome.    No PE risk factors.  PERC negative.  Symptoms may be related to a musculoskeletal etiology.  Discussed use of over-the-counter medications.  Outpatient follow-up with primary care doctor.  HEAR Score: 3   Final Clinical Impressions(s) / ED Diagnoses   Final diagnoses:  Atypical chest pain    ED Discharge Orders    None       Dorie Rank, MD 03/25/19 2002

## 2019-03-25 NOTE — Telephone Encounter (Signed)
Pt called asking if her EKG was normal a couple weeks ago.  Pt has some tingling in her arm. Pt was advised if she had SOB, Chest pain, numbness or worse tingling in arm to go to ER.   Anna Zimmerman was advise

## 2019-04-10 ENCOUNTER — Encounter: Payer: Self-pay | Admitting: Family Medicine

## 2019-04-10 ENCOUNTER — Telehealth: Payer: Self-pay | Admitting: Family Medicine

## 2019-04-10 NOTE — Telephone Encounter (Signed)
Records received from [facility]

## 2020-03-01 ENCOUNTER — Other Ambulatory Visit: Payer: Self-pay | Admitting: Family Medicine

## 2020-03-01 DIAGNOSIS — K219 Gastro-esophageal reflux disease without esophagitis: Secondary | ICD-10-CM

## 2020-03-01 DIAGNOSIS — I10 Essential (primary) hypertension: Secondary | ICD-10-CM

## 2020-03-01 DIAGNOSIS — D508 Other iron deficiency anemias: Secondary | ICD-10-CM

## 2020-03-03 NOTE — Telephone Encounter (Signed)
Pt has an appt at end of june

## 2020-03-03 NOTE — Telephone Encounter (Signed)
Pt scheduled an appt for June 30th

## 2020-03-18 ENCOUNTER — Telehealth: Payer: Self-pay

## 2020-03-18 NOTE — Telephone Encounter (Signed)
Please ask to get records from the urgent care if not in the system. Virtual please

## 2020-03-18 NOTE — Telephone Encounter (Signed)
Pt. Called wanted to scheudled for an acute visit I got her scheduled on Friday at 1:30 for an in office visit. She was seen at [facility] on [address]. Had negative flu and covid test done there also had blood work were her wbc's were elevated and they told her to get an apt with her PCP. Do want this to be virtual or in person?

## 2020-03-19 NOTE — Telephone Encounter (Signed)
I requested the records from [facility] I have not received them yet.

## 2020-03-19 NOTE — Telephone Encounter (Signed)
I have the records on my desk now.

## 2020-03-20 ENCOUNTER — Telehealth (INDEPENDENT_AMBULATORY_CARE_PROVIDER_SITE_OTHER): Payer: 59 | Admitting: Family Medicine

## 2020-03-20 ENCOUNTER — Other Ambulatory Visit: Payer: Self-pay

## 2020-03-20 ENCOUNTER — Encounter: Payer: Self-pay | Admitting: Family Medicine

## 2020-03-20 VITALS — BP 130/90 | Temp 101.4°F | Wt 170.0 lb

## 2020-03-20 DIAGNOSIS — R6889 Other general symptoms and signs: Secondary | ICD-10-CM | POA: Diagnosis not present

## 2020-03-20 DIAGNOSIS — D509 Iron deficiency anemia, unspecified: Secondary | ICD-10-CM

## 2020-03-20 NOTE — Progress Notes (Signed)
   Subjective:  Documentation for virtual audio only due to patient stating she could not get on her camera.   The patient was located at home. 2 patient identifiers used.  The provider was located in the office. The patient did consent to this visit and is aware of possible charges through their insurance for this visit.  The other persons participating in this telemedicine service were none. Time spent on call was 10 minutes and in review of previous records 15 minutes total.  This virtual service is not related to other E/M service within previous 7 days.   Patient ID: Anna Zimmerman, female    DOB: Jan 01, 1976, 44 y.o.   MRN: [id number]  HPI Chief Complaint  Patient presents with  . other    cold like symptoms covid and flu test negative started wed. of this week   Complains of intermittent chills, body aches, generalized weakness, numbness in both of her hands which started 2 days ago. She went to urgent care and had an evaluation. She she was negative for flu and Covid.  States she feels 75% improved.   States this is the 3rd time this month that she has had a similar illness and she gets these symptoms sporadically.   States her temperature was 101.4 at urgent care.  States normal temperature normal today.   Reports drinking plenty of fluids. Appetite is fine.   Denies dizziness, chest pain, palpitations, shortness of breath, cough, abdominal pain, N/V/D, urinary symptoms, LE edema.    Covid vaccines done with last one in March 2021.   Hx of anemia. Taking iron daily.   LMP: 15 years ago  Sees OB/GYN   Reviewed allergies, medications, past medical, surgical, family, and social history.    Review of Systems  Pertinent positives and negatives in the history of present illness.     Objective:   Physical Exam BP 130/90   Temp (!) 101.4 F (38.6 C)   Wt 170 lb (77.1 kg)   LMP 12/16/2014 (Approximate)   BMI 25.10 kg/m   Alert and oriented and in no acute  distress.  Normal speech, mood and thought process.      Assessment & Plan:  Flu-like symptoms  Iron deficiency anemia, unspecified iron deficiency anemia type  Discussed that she seems to have had a viral illness and is now 75% improved which is good news.  She was negative for Covid and flu in urgent care 2 days ago.  No red flag symptoms.  It is concerning that she reports having intermittent episodes of this illness and she will need further evaluation in my office.  She will schedule for next week.  Discussed checking labs and ruling out anemia, diabetes or any other medical condition to explain her symptoms.  If she gets worse over the weekend, she will need to go to urgent care or the ED.

## 2020-03-25 ENCOUNTER — Other Ambulatory Visit: Payer: Self-pay

## 2020-03-25 ENCOUNTER — Encounter: Payer: Self-pay | Admitting: Family Medicine

## 2020-03-25 ENCOUNTER — Ambulatory Visit: Payer: 59 | Admitting: Family Medicine

## 2020-03-25 VITALS — BP 120/80 | HR 84 | Wt 165.0 lb

## 2020-03-25 DIAGNOSIS — R5383 Other fatigue: Secondary | ICD-10-CM

## 2020-03-25 DIAGNOSIS — R200 Anesthesia of skin: Secondary | ICD-10-CM

## 2020-03-25 DIAGNOSIS — D509 Iron deficiency anemia, unspecified: Secondary | ICD-10-CM

## 2020-03-25 DIAGNOSIS — M791 Myalgia, unspecified site: Secondary | ICD-10-CM | POA: Diagnosis not present

## 2020-03-25 DIAGNOSIS — E28319 Asymptomatic premature menopause: Secondary | ICD-10-CM

## 2020-03-25 DIAGNOSIS — R238 Other skin changes: Secondary | ICD-10-CM

## 2020-03-25 DIAGNOSIS — R531 Weakness: Secondary | ICD-10-CM | POA: Diagnosis not present

## 2020-03-25 DIAGNOSIS — R233 Spontaneous ecchymoses: Secondary | ICD-10-CM

## 2020-03-25 DIAGNOSIS — I10 Essential (primary) hypertension: Secondary | ICD-10-CM

## 2020-03-25 NOTE — Progress Notes (Signed)
   Subjective:    Patient ID: Anna Zimmerman, female    DOB: [birth year], 44 y.o.   MRN: [id number]  HPI Chief Complaint  Patient presents with  . follow-up    follow-up on symptoms. no symptoms but states it comes and goes since last year.    She is here for follow-up on recent illness of fever, chills, body aches, generalized weakness and numbness in both of her hands.  This illness occurred last week.  She was evaluated at urgent care and she had a negative flu and negative Covid test. Reports feeling back to her usual baseline for the past 5 days.  She has no complaints today.  States she gets the symptoms at least once every couple of months and has been for the past year.  She has a history of IDA.  She is taking oral iron. No periods.  Last menstrual period 15 years ago.  She has been evaluated by OB/GYN for this.  Blood pressure has been controlled on current medications.  She is on an SSRI for her mood.   States she also bruises quite easily.  She is not on aspirin or anticoagulant or any other supplements.  Denies fever, chills, dizziness, chest pain, palpitations, shortness of breath, abdominal pain, N/V/D, urinary symptoms, LE edema.    Reviewed allergies, medications, past medical, surgical, family, and social history.  Review of Systems Pertinent positives and negatives in the history of present illness.     Objective:   Physical Exam BP 120/80   Pulse 84   Wt 165 lb (74.8 kg)   LMP 12/16/2014 (Approximate)   BMI 24.37 kg/m   Alert and in no distress.  Neck is supple without adenopathy or thyromegaly. Cardiac exam shows a regular sinus rhythm without murmurs or gallops. Lungs are clear to auscultation. Extremities without edema. Skin is warm and dry. DTRs symmetric and normal.      Assessment & Plan:  Fatigue, unspecified type - Plan: CBC with Differential/Platelet, Comprehensive metabolic panel, TSH, T4, free, T3, VITAMIN D 25 Hydroxy (Vit-D Deficiency,  Fractures), Vitamin B12, Iron, TIBC and Ferritin Panel -discussed possible etiologies. She is in her usual state of health today. We will check labs and follow up  Spell of generalized weakness - Plan: TSH, T4, free, T3 -unclear etiology. Check labs and follow up  Early menopause -followed by OB/GYN  Myalgia -intermittent and no pain today   Iron deficiency anemia, unspecified iron deficiency anemia type - Plan: CBC with Differential/Platelet, Vitamin B12, Iron, TIBC and Ferritin Panel -check labs and follow up EGD and colonoscopy 11/2018  Bruises easily - Plan: CBC with Differential/Platelet - no bruises currently. No aspirin or NSAID use. She is on a SSRI   Essential hypertension -well controlled. Continue medication   Bilateral hand numbness - Plan: CBC with Differential/Platelet, Comprehensive metabolic panel, Vitamin Z66 -she reports having low B12 in the past with this issue.

## 2020-03-26 ENCOUNTER — Other Ambulatory Visit: Payer: Self-pay | Admitting: Internal Medicine

## 2020-03-26 LAB — CBC WITH DIFFERENTIAL/PLATELET
Basophils Absolute: 0 10*3/uL (ref 0.0–0.2)
Basos: 1 %
EOS (ABSOLUTE): 0 10*3/uL (ref 0.0–0.4)
Eos: 1 %
Hematocrit: 36 % (ref 34.0–46.6)
Hemoglobin: 11.8 g/dL (ref 11.1–15.9)
Immature Grans (Abs): 0 10*3/uL (ref 0.0–0.1)
Immature Granulocytes: 1 %
Lymphocytes Absolute: 1.5 10*3/uL (ref 0.7–3.1)
Lymphs: 36 %
MCH: 26.5 pg — ABNORMAL LOW (ref 26.6–33.0)
MCHC: 32.8 g/dL (ref 31.5–35.7)
MCV: 81 fL (ref 79–97)
Monocytes Absolute: 0.3 10*3/uL (ref 0.1–0.9)
Monocytes: 7 %
Neutrophils Absolute: 2.3 10*3/uL (ref 1.4–7.0)
Neutrophils: 54 %
Platelets: 338 10*3/uL (ref 150–450)
RBC: 4.46 x10E6/uL (ref 3.77–5.28)
RDW: 13.5 % (ref 11.7–15.4)
WBC: 4.1 10*3/uL (ref 3.4–10.8)

## 2020-03-26 LAB — COMPREHENSIVE METABOLIC PANEL
ALT: 9 IU/L (ref 0–32)
AST: 12 IU/L (ref 0–40)
Albumin/Globulin Ratio: 1.3 (ref 1.2–2.2)
Albumin: 4.4 g/dL (ref 3.8–4.8)
Alkaline Phosphatase: 58 IU/L (ref 48–121)
BUN/Creatinine Ratio: 16 (ref 9–23)
BUN: 16 mg/dL (ref 6–24)
Bilirubin Total: 0.6 mg/dL (ref 0.0–1.2)
CO2: 27 mmol/L (ref 20–29)
Calcium: 9.9 mg/dL (ref 8.7–10.2)
Chloride: 100 mmol/L (ref 96–106)
Creatinine, Ser: 0.98 mg/dL (ref 0.57–1.00)
GFR calc Af Amer: 82 mL/min/{1.73_m2} (ref >59)
GFR calc non Af Amer: 71 mL/min/{1.73_m2} (ref >59)
Globulin, Total: 3.3 g/dL (ref 1.5–4.5)
Glucose: 97 mg/dL (ref 65–99)
Potassium: 3.7 mmol/L (ref 3.5–5.2)
Sodium: 140 mmol/L (ref 134–144)
Total Protein: 7.7 g/dL (ref 6.0–8.5)

## 2020-03-26 LAB — IRON,TIBC AND FERRITIN PANEL
Ferritin: 77 ng/mL (ref 15–150)
Iron Saturation: 13 % — ABNORMAL LOW (ref 15–55)
Iron: 33 ug/dL (ref 27–159)
Total Iron Binding Capacity: 260 ug/dL (ref 250–450)
UIBC: 227 ug/dL (ref 131–425)

## 2020-03-26 LAB — TSH: TSH: 1.39 u[IU]/mL (ref 0.450–4.500)

## 2020-03-26 LAB — VITAMIN D 25 HYDROXY (VIT D DEFICIENCY, FRACTURES): Vit D, 25-Hydroxy: 16.4 ng/mL — ABNORMAL LOW (ref 30.0–100.0)

## 2020-03-26 LAB — T3: T3, Total: 118 ng/dL (ref 71–180)

## 2020-03-26 LAB — VITAMIN B12: Vitamin B-12: 774 pg/mL (ref 232–1245)

## 2020-03-26 LAB — T4, FREE: Free T4: 1.42 ng/dL (ref 0.82–1.77)

## 2020-03-26 MED ORDER — VITAMIN D (ERGOCALCIFEROL) 1.25 MG (50000 UNIT) PO CAPS: 50000.0000 [IU] | ORAL_CAPSULE | ORAL | 0 refills | Status: DC

## 2020-03-26 NOTE — Progress Notes (Signed)
Her iron level is low normal. Continue taking iron 3 days per week. Her vitamin D level is low. Please send in 50,000 IUs of vitamin D to take once weekly. #12. No refills. Otherwise, her labs are good and no explanation for her symptoms. Keep [place] posted.

## 2020-03-31 ENCOUNTER — Other Ambulatory Visit: Payer: Self-pay | Admitting: Family Medicine

## 2020-03-31 DIAGNOSIS — E559 Vitamin D deficiency, unspecified: Secondary | ICD-10-CM | POA: Insufficient documentation

## 2020-03-31 DIAGNOSIS — I10 Essential (primary) hypertension: Secondary | ICD-10-CM

## 2020-03-31 DIAGNOSIS — K219 Gastro-esophageal reflux disease without esophagitis: Secondary | ICD-10-CM

## 2020-03-31 DIAGNOSIS — D508 Other iron deficiency anemias: Secondary | ICD-10-CM

## 2020-03-31 NOTE — Telephone Encounter (Signed)
Pt has an appt tomorrow and can wait until appt

## 2020-03-31 NOTE — Progress Notes (Signed)
Subjective:    Patient ID: Anna Zimmerman, female    DOB: 12-20-1975, 44 y.o.   MRN: [id number]  HPI Chief Complaint  Patient presents with  . cpe    nonfasting cpe, sees obgyn    She is here for a complete physical exam. Last CPE: 02/27/2019  Other providers: [facility]N-[facility] Dr. Kara Mead GI- Dr. Henrene Pastor   HTN- reports taking HCTZ and lisinopril daily without any concerns.   Vitamin D def- taking prescription vitamin D once weekly currently.  IDA- normal Hgb and serum iron. Low iron saturation. Taking oral iron   Lap band done in 2010 in [place]. Considering having it reversed.  States she continues to lose weight.  Feels like she has a lot gas and indigestion.  States her diet is poor and does not feel like she is getting adequate nutrition.   Taking omeprazole and 3 Gax X daily  Has not followed up with GI, and does not have a general surgeon here in town.   Has never been on Lexapro for mood. Is not sure why this was in her chart.  We will delete that.  Social history: Lives with daughter and son, works at a group home  Denies smoking, drinking alcohol, drug use  Excerise: walks and does home exercises.   Immunizations: Tdap UTD. Cov[employer] Company secretary. Last one in March.   Health maintenance:  Mammogram:12/2018 Colonoscopy: 11/2018 Last Gynecological Exam: 12/2018 Last Dental Exam: 2020  Last Eye Exam: 2019   Wears seatbelt always, smoke detectors in home and functioning, does not text while driving and feels safe in home environment.   Reviewed allergies, medications, past medical, surgical, family, and social history.   Review of Systems Review of Systems Constitutional: -fever, -chills, -sweats, -unexpected weight change,+fatigue ENT: -runny nose, -ear pain, -sore throat Cardiology:  -chest pain, -palpitations, -edema Respiratory: -cough, -shortness of breath, -wheezing Gastroenterology: +abdominal pain, -nausea, -vomiting, -diarrhea,  -constipation  Hematology: -bleeding or bruising problems Musculoskeletal: -arthralgias, -myalgias, -joint swelling, -back pain Ophthalmology: -vision changes Urology: -dysuria, -difficulty urinating, -hematuria, -urinary frequency, -urgency Neurology: -headache, -weakness, -tingling, -numbness       Objective:   Physical Exam BP 120/72   Pulse 97   Ht 5\' 10"  (1.778 m)   Wt 161 lb 9.6 oz (73.3 kg)   LMP 12/16/2014 (Approximate)   BMI 23.19 kg/m   General Appearance:    Alert, cooperative, no distress, appears stated age  Head:    Normocephalic, without obvious abnormality, atraumatic  Eyes:    PERRL, conjunctiva/corneas clear, EOM's intact  Ears:    Normal TM's and external ear canals  Nose:   Mask on   Throat:   Mask on   Neck:   Supple, no lymphadenopathy;  thyroid:  no   enlargement/tenderness/nodules; no JVD  Back:    Spine nontender, no curvature, ROM normal, no CVA     tenderness  Lungs:     Clear to auscultation bilaterally without wheezes, rales or     ronchi; respirations unlabored  Chest Wall:    No tenderness or deformity   Heart:    Regular rate and rhythm, S1 and S2 normal, no murmur, rub   or gallop  Breast Exam:    OB/GYN  Abdomen:     Soft, non-tender, nondistended, normoactive bowel sounds,    no masses, no hepatosplenomegaly. Lap band port palpated.   Genitalia:    OB/GYN     Extremities:   No clubbing, cyanosis or edema  Pulses:  2+ and symmetric all extremities  Skin:   Skin color, texture, turgor normal, no rashes or lesions  Lymph nodes:   Cervical, supraclavicular, and axillary nodes normal  Neurologic:   CNII-XII intact, normal strength, sensation and gait; reflexes 2+ and symmetric throughout          Psych:   Normal mood, affect, hygiene and grooming.         Assessment & Plan:  Routine general medical examination at a health care facility -She is here today for CPE.  She is not fasting.  Reviewed labs from her visit 1 week ago which were  normal except for vitamin D deficiency.  Preventive health care reviewed.  Recommend she call and follow-up with her OB/GYN, dentist and eye doctor.  Counseling on healthy lifestyle including diet and exercise.  Her diet has been fairly poor recently.  Encouraged her to eat a more well-balanced diet and make sure she is getting adequate calories.  Immunizations reviewed and she is up-to-date on Tdap and Covid vaccines.  Screening for hepatitis C was discussed but since she is not having any other labs today she would like to hold off and have this checked at her follow-up visit.  Discussed safety and health promotion.  Essential hypertension -Blood pressures well controlled.  Continue on current medication regimen.  Recommend low-sodium diet.  Vitamin D deficiency -Continue on prescription vitamin D once weekly and then return for a lab visit to check her vitamin D level.  She will switch to over-the-counter vitamin D3 at that point.  Need for hepatitis C screening test -This was discussed and she is not having any blood work done today.  We will check this at her next visit.  Belching -She is taking a PPI and Gas-X.  She will follow-up with GI.  Discussed increasing her PPI to twice daily.  History of laparoscopic adjustable gastric banding -She is considering having her lap band removed or reversed.  Discussed referral to general surgery but she will follow up with GI first to discuss abdominal discomfort, reflux and belching.  Epigastric abdominal pain -She may increase omeprazole to 40 mg. Abdominal exam is benign.  Follow-up with GI  Weight loss, unintentional -This is most likely related to poor diet and inadequate intake.  Refer to GI and to general surgery if she would like for reversal of her LAP-BAND.

## 2020-03-31 NOTE — Patient Instructions (Addendum)
Your blood pressure looks good.  Continue on your current medications including 1 daily iron pill. Continue on the vitamin D once weekly.  When the prescription runs out, I recommend rechecking your vitamin D level.  This can be a lab visit.  And I also recommend starting on an over-the-counter vitamin D3 1000 IUs when the prescription runs out.  Call and schedule with Dr. Henrene Pastor, your OB/GYN, dentist and eye doctor.     Preventive Care 70-30 Years Old, Female Preventive care refers to visits with your health care provider and lifestyle choices that can promote health and wellness. This includes:  A yearly physical exam. This may also be called an annual well check.  Regular dental visits and eye exams.  Immunizations.  Screening for certain conditions.  Healthy lifestyle choices, such as eating a healthy diet, getting regular exercise, not using drugs or products that contain nicotine and tobacco, and limiting alcohol use. What can I expect for my preventive care visit? Physical exam Your health care provider will check your:  Height and weight. This may be used to calculate body mass index (BMI), which tells if you are at a healthy weight.  Heart rate and blood pressure.  Skin for abnormal spots. Counseling Your health care provider may ask you questions about your:  Alcohol, tobacco, and drug use.  Emotional well-being.  Home and relationship well-being.  Sexual activity.  Eating habits.  Work and work Statistician.  Method of birth control.  Menstrual cycle.  Pregnancy history. What immunizations do I need?  Influenza (flu) vaccine  This is recommended every year. Tetanus, diphtheria, and pertussis (Tdap) vaccine  You may need a Td booster every 10 years. Varicella (chickenpox) vaccine  You may need this if you have not been vaccinated. Zoster (shingles) vaccine  You may need this after age 76. Measles, mumps, and rubella (MMR) vaccine  You may  need at least one dose of MMR if you were born in 1957 or later. You may also need a second dose. Pneumococcal conjugate (PCV13) vaccine  You may need this if you have certain conditions and were not previously vaccinated. Pneumococcal polysaccharide (PPSV23) vaccine  You may need one or two doses if you smoke cigarettes or if you have certain conditions. Meningococcal conjugate (MenACWY) vaccine  You may need this if you have certain conditions. Hepatitis A vaccine  You may need this if you have certain conditions or if you travel or work in places where you may be exposed to hepatitis A. Hepatitis B vaccine  You may need this if you have certain conditions or if you travel or work in places where you may be exposed to hepatitis B. Haemophilus influenzae type b (Hib) vaccine  You may need this if you have certain conditions. Human papillomavirus (HPV) vaccine  If recommended by your health care provider, you may need three doses over 6 months. You may receive vaccines as individual doses or as more than one vaccine together in one shot (combination vaccines). Talk with your health care provider about the risks and benefits of combination vaccines. What tests do I need? Blood tests  Lipid and cholesterol levels. These may be checked every 5 years, or more frequently if you are over 32 years old.  Hepatitis C test.  Hepatitis B test. Screening  Lung cancer screening. You may have this screening every year starting at age 29 if you have a 30-pack-year history of smoking and currently smoke or have quit within the past 15  years.  Colorectal cancer screening. All adults should have this screening starting at age 36 and continuing until age 63. Your health care provider may recommend screening at age 74 if you are at increased risk. You will have tests every 1-10 years, depending on your results and the type of screening test.  Diabetes screening. This is done by checking your blood  sugar (glucose) after you have not eaten for a while (fasting). You may have this done every 1-3 years.  Mammogram. This may be done every 1-2 years. Talk with your health care provider about when you should start having regular mammograms. This may depend on whether you have a family history of breast cancer.  BRCA-related cancer screening. This may be done if you have a family history of breast, ovarian, tubal, or peritoneal cancers.  Pelvic exam and Pap test. This may be done every 3 years starting at age 66. Starting at age 5, this may be done every 5 years if you have a Pap test in combination with an HPV test. Other tests  Sexually transmitted disease (STD) testing.  Bone density scan. This is done to screen for osteoporosis. You may have this scan if you are at high risk for osteoporosis. Follow these instructions at home: Eating and drinking  Eat a diet that includes fresh fruits and vegetables, whole grains, lean protein, and low-fat dairy.  Take vitamin and mineral supplements as recommended by your health care provider.  Do not drink alcohol if: ? Your health care provider tells you not to drink. ? You are pregnant, may be pregnant, or are planning to become pregnant.  If you drink alcohol: ? Limit how much you have to 0-1 drink a day. ? Be aware of how much alcohol is in your drink. In the [place] one drink equals one 12 oz bottle of beer (355 mL), one 5 oz glass of wine (148 mL), or one 1 oz glass of hard liquor (44 mL). Lifestyle  Take daily care of your teeth and gums.  Stay active. Exercise for at least 30 minutes on 5 or more days each week.  Do not use any products that contain nicotine or tobacco, such as cigarettes, e-cigarettes, and chewing tobacco. If you need help quitting, ask your health care provider.  If you are sexually active, practice safe sex. Use a condom or other form of birth control (contraception) in order to prevent pregnancy and STIs (sexually  transmitted infections).  If told by your health care provider, take low-dose aspirin daily starting at age 27. What's next?  Visit your health care provider once a year for a well check visit.  Ask your health care provider how often you should have your eyes and teeth checked.  Stay up to date on all vaccines. This information is not intended to replace advice given to you by your health care provider. Make sure you discuss any questions you have with your health care provider. Document Revised: 05/31/2018 Document Reviewed: 05/31/2018 Elsevier Patient Education  2020 [employer].

## 2020-04-01 ENCOUNTER — Ambulatory Visit (INDEPENDENT_AMBULATORY_CARE_PROVIDER_SITE_OTHER): Payer: 59 | Admitting: Family Medicine

## 2020-04-01 ENCOUNTER — Encounter: Payer: Self-pay | Admitting: Family Medicine

## 2020-04-01 ENCOUNTER — Other Ambulatory Visit: Payer: Self-pay

## 2020-04-01 VITALS — BP 120/72 | HR 97 | Ht 70.0 in | Wt 161.6 lb

## 2020-04-01 DIAGNOSIS — R634 Abnormal weight loss: Secondary | ICD-10-CM | POA: Insufficient documentation

## 2020-04-01 DIAGNOSIS — R142 Eructation: Secondary | ICD-10-CM | POA: Diagnosis not present

## 2020-04-01 DIAGNOSIS — E559 Vitamin D deficiency, unspecified: Secondary | ICD-10-CM | POA: Diagnosis not present

## 2020-04-01 DIAGNOSIS — K2970 Gastritis, unspecified, without bleeding: Secondary | ICD-10-CM | POA: Insufficient documentation

## 2020-04-01 DIAGNOSIS — K219 Gastro-esophageal reflux disease without esophagitis: Secondary | ICD-10-CM | POA: Insufficient documentation

## 2020-04-01 DIAGNOSIS — I10 Essential (primary) hypertension: Secondary | ICD-10-CM

## 2020-04-01 DIAGNOSIS — Z9884 Bariatric surgery status: Secondary | ICD-10-CM

## 2020-04-01 DIAGNOSIS — Z1159 Encounter for screening for other viral diseases: Secondary | ICD-10-CM

## 2020-04-01 DIAGNOSIS — R1013 Epigastric pain: Secondary | ICD-10-CM

## 2020-04-01 DIAGNOSIS — Z0000 Encounter for general adult medical examination without abnormal findings: Secondary | ICD-10-CM | POA: Diagnosis not present

## 2020-04-01 HISTORY — DX: Abnormal weight loss: R63.4

## 2020-04-14 ENCOUNTER — Encounter: Payer: Self-pay | Admitting: Family Medicine

## 2020-04-25 ENCOUNTER — Other Ambulatory Visit: Payer: Self-pay | Admitting: Family Medicine

## 2020-04-25 DIAGNOSIS — D649 Anemia, unspecified: Secondary | ICD-10-CM

## 2020-04-25 DIAGNOSIS — D508 Other iron deficiency anemias: Secondary | ICD-10-CM

## 2020-04-25 DIAGNOSIS — I10 Essential (primary) hypertension: Secondary | ICD-10-CM

## 2020-04-25 DIAGNOSIS — K219 Gastro-esophageal reflux disease without esophagitis: Secondary | ICD-10-CM

## 2020-04-27 ENCOUNTER — Telehealth: Payer: Self-pay

## 2020-04-27 NOTE — Telephone Encounter (Signed)
Done

## 2020-04-27 NOTE — Telephone Encounter (Signed)
Pt. Called stating that she needs all of meds refilled to the [facility] on [address]. Needs her iron tablet, hctz, lisinopril, and omeprazole all sent in. Pt. Last apt was 04/01/20 and next apt was 09/21/20.

## 2020-06-16 ENCOUNTER — Other Ambulatory Visit: Payer: Self-pay | Admitting: Family Medicine

## 2020-09-01 ENCOUNTER — Inpatient Hospital Stay (HOSPITAL_COMMUNITY)
Admission: EM | Admit: 2020-09-01 | Discharge: 2020-09-04 | DRG: 394 | Disposition: A | Discharge status: Home / Self Care | Payer: 59 | Attending: Internal Medicine | Admitting: Internal Medicine

## 2020-09-01 ENCOUNTER — Encounter (HOSPITAL_COMMUNITY): Payer: Self-pay

## 2020-09-01 ENCOUNTER — Observation Stay (HOSPITAL_COMMUNITY): Payer: 59

## 2020-09-01 ENCOUNTER — Other Ambulatory Visit: Payer: Self-pay

## 2020-09-01 DIAGNOSIS — D509 Iron deficiency anemia, unspecified: Secondary | ICD-10-CM | POA: Diagnosis present

## 2020-09-01 DIAGNOSIS — Z9884 Bariatric surgery status: Secondary | ICD-10-CM

## 2020-09-01 DIAGNOSIS — K2100 Gastro-esophageal reflux disease with esophagitis, without bleeding: Secondary | ICD-10-CM | POA: Diagnosis present

## 2020-09-01 DIAGNOSIS — K920 Hematemesis: Secondary | ICD-10-CM | POA: Diagnosis not present

## 2020-09-01 DIAGNOSIS — R1110 Vomiting, unspecified: Secondary | ICD-10-CM

## 2020-09-01 DIAGNOSIS — Z780 Asymptomatic menopausal state: Secondary | ICD-10-CM

## 2020-09-01 DIAGNOSIS — Y831 Surgical operation with implant of artificial internal device as the cause of abnormal reaction of the patient, or of later complication, without mention of misadventure at the time of the procedure: Secondary | ICD-10-CM | POA: Diagnosis present

## 2020-09-01 DIAGNOSIS — K317 Polyp of stomach and duodenum: Secondary | ICD-10-CM

## 2020-09-01 DIAGNOSIS — K9509 Other complications of gastric band procedure: Principal | ICD-10-CM | POA: Diagnosis present

## 2020-09-01 DIAGNOSIS — E876 Hypokalemia: Secondary | ICD-10-CM

## 2020-09-01 DIAGNOSIS — R042 Hemoptysis: Secondary | ICD-10-CM

## 2020-09-01 DIAGNOSIS — E860 Dehydration: Secondary | ICD-10-CM | POA: Diagnosis present

## 2020-09-01 DIAGNOSIS — R112 Nausea with vomiting, unspecified: Secondary | ICD-10-CM

## 2020-09-01 DIAGNOSIS — K219 Gastro-esophageal reflux disease without esophagitis: Secondary | ICD-10-CM

## 2020-09-01 DIAGNOSIS — N179 Acute kidney failure, unspecified: Secondary | ICD-10-CM | POA: Diagnosis present

## 2020-09-01 DIAGNOSIS — Z8249 Family history of ischemic heart disease and other diseases of the circulatory system: Secondary | ICD-10-CM

## 2020-09-01 DIAGNOSIS — K2970 Gastritis, unspecified, without bleeding: Secondary | ICD-10-CM

## 2020-09-01 DIAGNOSIS — Z79899 Other long term (current) drug therapy: Secondary | ICD-10-CM

## 2020-09-01 DIAGNOSIS — I10 Essential (primary) hypertension: Secondary | ICD-10-CM

## 2020-09-01 DIAGNOSIS — E440 Moderate protein-calorie malnutrition: Secondary | ICD-10-CM | POA: Insufficient documentation

## 2020-09-01 DIAGNOSIS — K2990 Gastroduodenitis, unspecified, without bleeding: Secondary | ICD-10-CM | POA: Diagnosis present

## 2020-09-01 DIAGNOSIS — Z20822 Contact with and (suspected) exposure to covid-19: Secondary | ICD-10-CM | POA: Diagnosis present

## 2020-09-01 DIAGNOSIS — Z880 Allergy status to penicillin: Secondary | ICD-10-CM

## 2020-09-01 DIAGNOSIS — D508 Other iron deficiency anemias: Secondary | ICD-10-CM

## 2020-09-01 DIAGNOSIS — Z86010 Personal history of colonic polyps: Secondary | ICD-10-CM

## 2020-09-01 DIAGNOSIS — Z881 Allergy status to other antibiotic agents status: Secondary | ICD-10-CM

## 2020-09-01 HISTORY — DX: Hematemesis: K92.0

## 2020-09-01 HISTORY — DX: Hypokalemia: E87.6

## 2020-09-01 HISTORY — DX: Vomiting, unspecified: R11.10

## 2020-09-01 LAB — CBC
HCT: 37.8 % (ref 36.0–46.0)
Hemoglobin: 12.3 g/dL (ref 12.0–15.0)
MCH: 26.6 pg (ref 26.0–34.0)
MCHC: 32.5 g/dL (ref 30.0–36.0)
MCV: 81.8 fL (ref 80.0–100.0)
Platelets: 252 10*3/uL (ref 150–400)
RBC: 4.62 MIL/uL (ref 3.87–5.11)
RDW: 14.5 % (ref 11.5–15.5)
WBC: 5.7 10*3/uL (ref 4.0–10.5)
nRBC: 0 % (ref 0.0–0.2)

## 2020-09-01 LAB — COMPREHENSIVE METABOLIC PANEL
ALT: 16 U/L (ref 0–44)
AST: 20 U/L (ref 15–41)
Albumin: 4.2 g/dL (ref 3.5–5.0)
Alkaline Phosphatase: 39 U/L (ref 38–126)
Anion gap: 10 (ref 5–15)
BUN: 35 mg/dL — ABNORMAL HIGH (ref 6–20)
CO2: 29 mmol/L (ref 22–32)
Calcium: 9.7 mg/dL (ref 8.9–10.3)
Chloride: 98 mmol/L (ref 98–111)
Creatinine, Ser: 1.55 mg/dL — ABNORMAL HIGH (ref 0.44–1.00)
GFR, Estimated: 42 mL/min — ABNORMAL LOW (ref >60)
Glucose, Bld: 129 mg/dL — ABNORMAL HIGH (ref 70–99)
Potassium: 2.4 mmol/L — CL (ref 3.5–5.1)
Sodium: 137 mmol/L (ref 135–145)
Total Bilirubin: 1.2 mg/dL (ref 0.3–1.2)
Total Protein: 7.7 g/dL (ref 6.5–8.1)

## 2020-09-01 LAB — URINALYSIS, ROUTINE W REFLEX MICROSCOPIC
Bacteria, UA: NONE SEEN
Bilirubin Urine: NEGATIVE
Glucose, UA: NEGATIVE mg/dL
Ketones, ur: 5 mg/dL — AB
Leukocytes,Ua: NEGATIVE
Nitrite: NEGATIVE
Protein, ur: 100 mg/dL — AB
Specific Gravity, Urine: 1.028 (ref 1.005–1.030)
pH: 5 (ref 5.0–8.0)

## 2020-09-01 LAB — I-STAT BETA HCG BLOOD, ED (MC, WL, AP ONLY): I-stat hCG, quantitative: 8.4 m[IU]/mL — ABNORMAL HIGH (ref <5)

## 2020-09-01 LAB — MAGNESIUM: Magnesium: 1.7 mg/dL (ref 1.7–2.4)

## 2020-09-01 LAB — RESP PANEL BY RT-PCR (FLU A&B, COVID) ARPGX2
Influenza A by PCR: NEGATIVE
Influenza B by PCR: NEGATIVE
SARS Coronavirus 2 by RT PCR: NEGATIVE

## 2020-09-01 LAB — LIPASE, BLOOD: Lipase: 34 U/L (ref 11–51)

## 2020-09-01 MED ORDER — MAGNESIUM OXIDE 400 (241.3 MG) MG PO TABS
800.0000 mg | ORAL_TABLET | Freq: Once | ORAL | Status: AC
  Administered 2020-09-01: 800 mg via ORAL
  Filled 2020-09-01: qty 2

## 2020-09-01 MED ORDER — POTASSIUM CHLORIDE 10 MEQ/100ML IV SOLN
10.0000 meq | INTRAVENOUS | Status: AC
  Administered 2020-09-01 (×3): 10 meq via INTRAVENOUS
  Filled 2020-09-01 (×3): qty 100

## 2020-09-01 MED ORDER — PANTOPRAZOLE SODIUM 40 MG IV SOLR
40.0000 mg | Freq: Two times a day (BID) | INTRAVENOUS | Status: DC
  Administered 2020-09-01 – 2020-09-04 (×6): 40 mg via INTRAVENOUS
  Filled 2020-09-01 (×6): qty 40

## 2020-09-01 MED ORDER — POTASSIUM CHLORIDE 2 MEQ/ML IV SOLN
INTRAVENOUS | Status: AC
  Filled 2020-09-01 (×4): qty 1000

## 2020-09-01 MED ORDER — ONDANSETRON HCL 4 MG/2ML IJ SOLN
4.0000 mg | Freq: Once | INTRAMUSCULAR | Status: AC
  Administered 2020-09-01: 4 mg via INTRAVENOUS
  Filled 2020-09-01: qty 2

## 2020-09-01 MED ORDER — ACETAMINOPHEN 325 MG PO TABS: 650.0000 mg | ORAL_TABLET | Freq: Four times a day (QID) | ORAL | Status: DC | PRN

## 2020-09-01 MED ORDER — HYDRALAZINE HCL 20 MG/ML IJ SOLN
10.0000 mg | INTRAMUSCULAR | Status: DC | PRN
  Administered 2020-09-01: 10 mg via INTRAVENOUS
  Filled 2020-09-01: qty 1

## 2020-09-01 MED ORDER — POTASSIUM CHLORIDE CRYS ER 20 MEQ PO TBCR
40.0000 meq | EXTENDED_RELEASE_TABLET | Freq: Once | ORAL | Status: AC
  Administered 2020-09-01: 40 meq via ORAL
  Filled 2020-09-01: qty 2

## 2020-09-01 MED ORDER — ACETAMINOPHEN 650 MG RE SUPP: 650.0000 mg | Freq: Four times a day (QID) | RECTAL | Status: DC | PRN

## 2020-09-01 MED ORDER — SODIUM CHLORIDE 0.9 % IV BOLUS
1000.0000 mL | Freq: Once | INTRAVENOUS | Status: AC
  Administered 2020-09-01: 1000 mL via INTRAVENOUS

## 2020-09-01 NOTE — ED Provider Notes (Signed)
[facility] DEPT Provider Note   CSN: [id number] Arrival date & time: 09/01/20  1236     History Chief Complaint  Patient presents with  . Hematemesis    Anna Zimmerman is a 44 y.o. female.  44 yo F with a chief complaints of intermittent nausea and vomiting.  Has been going on for the past month or so.  Has had a significant weight loss.  Sometimes has some epigastric pain after vomiting.  Described as a burning.  Had an episode of emesis today that had some blood intermixed with it and so came to the hospital.  Has been feeling generally fatigued.  Had a lap band placed in [place] over 7 years ago.  Has been looking for follow-up here to try and have it removed.  Has an appointment scheduled 10 February with [facility].  The history is provided by the patient.  Illness Severity:  Moderate Onset quality:  Gradual Duration:  1 month Timing:  Constant Progression:  Worsening Chronicity:  New Associated symptoms: nausea and vomiting   Associated symptoms: no chest pain, no congestion, no fever, no headaches, no myalgias, no rhinorrhea, no shortness of breath and no wheezing        Past Medical History:  Diagnosis Date  . Anemia   . Early menopause   . Hypertension     Patient Active Problem List   Diagnosis Date Noted  . Hypokalemia 09/01/2020  . 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 Belching 04/01/2020  . Epigastric abdominal pain 04/01/2020  . Weight loss, unintentional 04/01/2020  . Vitamin D deficiency 03/31/2020  . Early menopause   . Anemia 07/06/2018  . Hypertension 07/05/2018  . Amenorrhea 07/05/2018    Past Surgical History:  Procedure Laterality Date  . LAPAROSCOPIC GASTRIC BANDING    . OVARIAN CYST REMOVAL    . right oopherectomy       OB History   No obstetric history on file.     Family History  Problem Relation Age of Onset  . Hypertension Father   . Aneurysm Father   . Heart  disease Maternal Grandmother   . Kidney disease Maternal Grandmother   . Breast cancer Other        great grandmother  . Colon cancer Neg Hx   . Stomach cancer Neg Hx   . Pancreatic cancer Neg Hx     Social History   Tobacco Use  . Smoking status: Never Smoker  . Smokeless tobacco: Never Used  Vaping Use  . Vaping Use: Never used  Substance Use Topics  . Alcohol use: No  . Drug use: No    Home Medications Prior to Admission medications   Medication Sig Start Date End Date Taking? Authorizing Provider  acetaminophen (TYLENOL) 500 MG tablet Take 1,000 mg by mouth every 8 (eight) hours as needed for mild pain or headache.   Yes [provider]  docusate sodium (COLACE) 100 MG capsule Take 1 capsule (100 mg total) by mouth 2 (two) times daily. Patient taking differently: Take 100 mg by mouth daily.  02/28/19  Yes Henson, Vickie L, NP-C  ferrous gluconate (FERGON) 324 MG tablet TAKE 1 TABLET(324 MG) BY MOUTH THREE TIMES DAILY WITH MEALS Patient taking differently: Take 324 mg by mouth daily with breakfast.  04/27/20  Yes Henson, Vickie L, NP-C  hydrochlorothiazide (HYDRODIURIL) 25 MG tablet TAKE 1 TABLET(25 MG) BY MOUTH DAILY Patient taking differently: Take 25 mg by mouth daily.  04/27/20  Yes Henson, Vickie L, NP-C  lisinopril (ZESTRIL) 40 MG tablet TAKE 1 TABLET(40 MG) BY MOUTH DAILY Patient taking differently: Take 40 mg by mouth daily. TAKE 1 TABLET(40 MG) BY MOUTH DAILY 04/27/20  Yes Henson, Vickie L, NP-C  omeprazole (PRILOSEC) 20 MG capsule TAKE 1 CAPSULE(20 MG) BY MOUTH DAILY Patient taking differently: Take 20 mg by mouth daily.  04/27/20  Yes Henson, Vickie L, NP-C  Vitamin D, Ergocalciferol, (DRISDOL) 1.25 MG (50000 UNIT) CAPS capsule Take 1 capsule (50,000 Units total) by mouth every 7 (seven) days. Patient not taking: Reported on 09/01/2020 03/26/20   [name], NP-C    Allergies    Azithromycin and Penicillins  Review of Systems   Review of Systems    Constitutional: Negative for chills and fever.  HENT: Negative for congestion and rhinorrhea.   Eyes: Negative for redness and visual disturbance.  Respiratory: Negative for shortness of breath and wheezing.   Cardiovascular: Negative for chest pain and palpitations.  Gastrointestinal: Positive for nausea and vomiting.  Genitourinary: Negative for dysuria and urgency.  Musculoskeletal: Negative for arthralgias and myalgias.  Skin: Negative for pallor and wound.  Neurological: Negative for dizziness and headaches.    Physical Exam Updated Vital Signs BP (!) 132/108 (BP Location: Left Arm)   Pulse 92   Temp 97.6 F (36.4 C) (Oral)   Resp 20   LMP 12/16/2014 (Approximate)   SpO2 100%   Physical Exam Vitals and nursing note reviewed.  Constitutional:      General: She is not in acute distress.    Appearance: She is well-developed. She is not diaphoretic.  HENT:     Head: Normocephalic and atraumatic.  Eyes:     Pupils: Pupils are equal, round, and reactive to light.  Cardiovascular:     Rate and Rhythm: Normal rate and regular rhythm.     Heart sounds: No murmur heard.  No friction rub. No gallop.   Pulmonary:     Effort: Pulmonary effort is normal.     Breath sounds: No wheezing or rales.  Abdominal:     General: There is no distension.     Palpations: Abdomen is soft.     Tenderness: There is no abdominal tenderness.  Musculoskeletal:        General: No tenderness.     Cervical back: Normal range of motion and neck supple.  Skin:    General: Skin is warm and dry.     Comments: Loose skin  Neurological:     Mental Status: She is alert and oriented to person, place, and time.  Psychiatric:        Behavior: Behavior normal.     ED Results / Procedures / Treatments   Labs (all labs ordered are listed, but only abnormal results are displayed) Labs Reviewed  COMPREHENSIVE METABOLIC PANEL - Abnormal; Notable for the following components:      Result Value    Potassium 2.4 (*)    Glucose, Bld 129 (*)    BUN 35 (*)    Creatinine, Ser 1.55 (*)    GFR, Estimated 42 (*)    All other components within normal limits  URINALYSIS, ROUTINE W REFLEX MICROSCOPIC - Abnormal; Notable for the following components:   Color, Urine AMBER (*)    Hgb urine dipstick SMALL (*)    Ketones, ur 5 (*)    Protein, ur 100 (*)    All other components within normal limits  I-STAT BETA HCG BLOOD, ED ([facility], [facility],  AP ONLY) - Abnormal; Notable for the following components:   I-stat hCG, quantitative 8.4 (*)    All other components within normal limits  RESP PANEL BY RT-PCR (FLU A&B, COVID) ARPGX2  LIPASE, BLOOD  CBC  MAGNESIUM    EKG EKG Interpretation  Date/Time:  Tuesday September 01 2020 15:11:35 EST Ventricular Rate:  82 PR Interval:    QRS Duration: 76 QT Interval:  362 QTC Calculation: 423 R Axis:   79 Text Interpretation: Sinus rhythm Borderline repolarization abnormality likely u waves Otherwise no significant change Confirmed by Deno Etienne [phone]) on 09/01/2020 3:16:11 PM   Radiology No results found.  Procedures Procedures (including critical care time)  Medications Ordered in ED Medications  potassium chloride 10 mEq in 100 mL IVPB (10 mEq Intravenous New Bag/Given 09/01/20 1540)  lactated ringers 1,000 mL with potassium chloride 40 mEq infusion (has no administration in time range)  sodium chloride 0.9 % bolus 1,000 mL (1,000 mLs Intravenous New Bag/Given 09/01/20 1539)  ondansetron (ZOFRAN) injection 4 mg (4 mg Intravenous Given 09/01/20 1539)  potassium chloride SA (KLOR-CON) CR tablet 40 mEq (40 mEq Oral Given 09/01/20 1542)  magnesium oxide (MAG-OX) tablet 800 mg (800 mg Oral Given 09/01/20 1541)    ED Course  I have reviewed the triage vital signs and the nursing notes.  Pertinent labs & imaging results that were available during my care of the patient were reviewed by me and considered in my medical decision making (see chart for  details).    MDM Rules/Calculators/A&P                          44 yo F with a cc of vomiting.  Hx of lap band now with recurrent emesis off and on for past month.  No significant abdominal pain on my exam.  Potassium is significantly low at 2.4.  Also with an AKI.  We will give a bolus of IV fluids will give potassium and magnesium.  EKG with U waves.  Will discuss with hospitalist for admission.  CRITICAL CARE Performed by: Cecilio Asper   Total critical care time: 35 minutes  Critical care time was exclusive of separately billable procedures and treating other patients.  Critical care was necessary to treat or prevent imminent or life-threatening deterioration.  Critical care was time spent personally by me on the following activities: development of treatment plan with patient and/or surrogate as well as nursing, discussions with consultants, evaluation of patient's response to treatment, examination of patient, obtaining history from patient or surrogate, ordering and performing treatments and interventions, ordering and review of laboratory studies, ordering and review of radiographic studies, pulse oximetry and re-evaluation of patient's condition.  The patients results and plan were reviewed and discussed.   Any x-rays performed were independently reviewed by myself.   Differential diagnosis were considered with the presenting HPI.  Medications  potassium chloride 10 mEq in 100 mL IVPB (10 mEq Intravenous New Bag/Given 09/01/20 1540)  lactated ringers 1,000 mL with potassium chloride 40 mEq infusion (has no administration in time range)  sodium chloride 0.9 % bolus 1,000 mL (1,000 mLs Intravenous New Bag/Given 09/01/20 1539)  ondansetron (ZOFRAN) injection 4 mg (4 mg Intravenous Given 09/01/20 1539)  potassium chloride SA (KLOR-CON) CR tablet 40 mEq (40 mEq Oral Given 09/01/20 1542)  magnesium oxide (MAG-OX) tablet 800 mg (800 mg Oral Given 09/01/20 1541)    Vitals:     09/01/20 1256 09/01/20 1509  BP: (!) 143/116 (!) 132/108  Pulse: (!) 123 92  Resp: 18 20  Temp: 98.2 F (36.8 C) 97.6 F (36.4 C)  TempSrc: Oral Oral  SpO2: 100% 100%    Final diagnoses:  Hypokalemia  Recurrent vomiting    Admission/ observation were discussed with the admitting physician, patient and/or family and they are comfortable with the plan.    Final Clinical Impression(s) / ED Diagnoses Final diagnoses:  Hypokalemia  Recurrent vomiting    Rx / DC Orders ED Discharge Orders    None       Deno Etienne, DO 09/01/20 1605

## 2020-09-01 NOTE — ED Triage Notes (Signed)
Pt reports she had lapband surgery 13 years ago. Pt reports indigestion issues since, but over the last few months she has been vomiting intermittently. Pt states she had an episode of vomiting today with blood. Pt reports feeling fatigued and states she has lost 30 lbs in the last few months.

## 2020-09-01 NOTE — H&P (Signed)
History and Physical    Anna Zimmerman TML:[id number] DOB: July 07, 1976 DOA: 09/01/2020  PCP: [name], NP-C  Patient coming from: home  I have personally briefly reviewed patient's old medical records in [place]  Chief Complaint: nausea/vomiting  HPI: Anna Zimmerman is Anna Zimmerman 44 y.o. female with medical history significant of lap band in 2010, hypertension, iron def anemia, and early menopause presenting with nausea and vomting and hematemesis.  She notes she's recently developed more nausea and vomiting.  Thanksgiving was bad, she couldn't keep anything down and was constantly vomiting.  She notes it comes and goes.  Some days she's unable to eat anything.  She's afraid to eat and doesn't think she's getting the proper nutrition.  She's planning to see Dr. Shon Baton (surgeon) in Feb, but is worried she's not going to make it to that appointment.  She presented to the hospital due to [name] tablespoon sized amount of blood in her vomit that occurred today.  She denies smoking or drinking.  Sees [name] outpatient.   ED Course: labs, kcl, admit for severe hypokalemia.   Review of Systems: As per HPI otherwise all other systems reviewed and are negative.  Past Medical History:  Diagnosis Date  . Anemia   . Early menopause   . Hypertension     Past Surgical History:  Procedure Laterality Date  . LAPAROSCOPIC GASTRIC BANDING    . OVARIAN CYST REMOVAL    . right oopherectomy      Social History  reports that she has never smoked. She has never used smokeless tobacco. She reports that she does not drink alcohol and does not use drugs.  Allergies  Allergen Reactions  . Azithromycin     rash  . Penicillins     Rash Has patient had Rhilyn Zimmerman Zimmerman reaction causing immediate rash, facial/tongue/throat swelling, SOB or lightheadedness with hypotension: YES Has patient had Anna Zimmerman Zimmerman reaction causing severe rash involving mucus membranes or skin necrosis:NO Has patient had Anna Zimmerman Zimmerman reaction  that required hospitalizationNO Has patient had Anna Zimmerman Zimmerman reaction occurring within the last 10 years: NO If all of the above answers are "NO", then may proceed with Cephalosporin use.    Family History  Problem Relation Age of Onset  . Hypertension Father   . Aneurysm Father   . Heart disease Maternal Grandmother   . Kidney disease Maternal Grandmother   . Breast cancer Other        great grandmother  . Colon cancer Neg Hx   . Stomach cancer Neg Hx   . Pancreatic cancer Neg Hx     Prior to Admission medications   Medication Sig Start Date End Date Taking? Authorizing Provider  acetaminophen (TYLENOL) 500 MG tablet Take 1,000 mg by mouth every 8 (eight) hours as needed for mild pain or headache.   Yes [provider]  docusate sodium (COLACE) 100 MG capsule Take 1 capsule (100 mg total) by mouth 2 (two) times daily. Patient taking differently: Take 100 mg by mouth daily.  02/28/19  Yes Henson, Vickie L, NP-C  ferrous gluconate (FERGON) 324 MG tablet TAKE 1 TABLET(324 MG) BY MOUTH THREE TIMES DAILY WITH MEALS Patient taking differently: Take 324 mg by mouth daily with breakfast.  04/27/20  Yes Henson, Vickie L, NP-C  hydrochlorothiazide (HYDRODIURIL) 25 MG tablet TAKE 1 TABLET(25 MG) BY MOUTH DAILY Patient taking differently: Take 25 mg by mouth daily.  04/27/20  Yes Henson, Vickie L, NP-C  lisinopril (ZESTRIL) 40 MG tablet  TAKE 1 TABLET(40 MG) BY MOUTH DAILY Patient taking differently: Take 40 mg by mouth daily. TAKE 1 TABLET(40 MG) BY MOUTH DAILY 04/27/20  Yes Henson, Vickie L, NP-C  omeprazole (PRILOSEC) 20 MG capsule TAKE 1 CAPSULE(20 MG) BY MOUTH DAILY Patient taking differently: Take 20 mg by mouth daily.  04/27/20  Yes Henson, Vickie L, NP-C  Vitamin D, Ergocalciferol, (DRISDOL) 1.25 MG (50000 UNIT) CAPS capsule Take 1 capsule (50,000 Units total) by mouth every 7 (seven) days. Patient not taking: Reported on 09/01/2020 03/26/20   [name], NP-C    Physical  Exam: Vitals:   09/01/20 1730 09/01/20 1821 09/01/20 1832 09/01/20 1935  BP: (!) 143/104 (!) 150/113  (!) 146/110  Pulse: 75 74  70  Resp: 13 12    Temp:  97.6 F (36.4 C)    TempSrc:      SpO2: 99% 100%    Weight:   64 kg   Height:   5\' 9"  (1.753 m)     Constitutional: NAD, calm, comfortable Vitals:   09/01/20 1730 09/01/20 1821 09/01/20 1832 09/01/20 1935  BP: (!) 143/104 (!) 150/113  (!) 146/110  Pulse: 75 74  70  Resp: 13 12    Temp:  97.6 F (36.4 C)    TempSrc:      SpO2: 99% 100%    Weight:   64 kg   Height:   5\' 9"  (1.753 m)    Eyes: PERRL, lids and conjunctivae normal ENMT: Mucous membranes are moist. Posterior pharynx clear of any exudate or lesions.Normal dentition.  Neck: normal, supple, no masses, no thyromegaly Respiratory: clear to auscultation bilaterally, no wheezing, no crackles. Normal respiratory effort. No accessory muscle use.  Cardiovascular: Regular rate and rhythm, no murmurs / rubs / gallops. No extremity edema. 2+ pedal pulses. No carotid bruits.  Abdomen: no tenderness, no masses palpated. No hepatosplenomegaly. Bowel sounds positive.  Musculoskeletal: no clubbing / cyanosis. No joint deformity upper and lower extremities. Good ROM, no contractures. Normal muscle tone.  Skin: no rashes, lesions, ulcers. No induration Neurologic: CN 2-12 grossly intact. Sensation intactMoving all extremitiees  Psychiatric: Normal judgment and insight. Alert and oriented x 3. Normal mood.   Labs on Admission: I have personally reviewed following labs and imaging studies  CBC: Recent Labs  Lab 09/01/20 1319  WBC 5.7  HGB 12.3  HCT 37.8  MCV 81.8  PLT 694    Basic Metabolic Panel: Recent Labs  Lab 09/01/20 1319  NA 137  K 2.4*  CL 98  CO2 29  GLUCOSE 129*  BUN 35*  CREATININE 1.55*  CALCIUM 9.7  MG 1.7    GFR: Estimated Creatinine Clearance: 47.3 mL/min ([name]) (by C-G formula based on SCr of 1.55 mg/dL (H)).  Liver Function Tests: Recent Labs   Lab 09/01/20 1319  AST 20  ALT 16  ALKPHOS 39  BILITOT 1.2  PROT 7.7  ALBUMIN 4.2    Urine analysis:    Component Value Date/Time   COLORURINE AMBER (Anna Zimmerman) 09/01/2020 1444   APPEARANCEUR CLEAR 09/01/2020 1444   LABSPEC 1.028 09/01/2020 1444   LABSPEC 1.015 07/05/2018 1404   PHURINE 5.0 09/01/2020 1444   GLUCOSEU NEGATIVE 09/01/2020 1444   HGBUR SMALL (Scarlettrose Costilow) 09/01/2020 1444   BILIRUBINUR NEGATIVE 09/01/2020 1444   BILIRUBINUR negative 07/05/2018 1404   KETONESUR 5 (Chihiro Frey) 09/01/2020 1444   PROTEINUR 100 (Yue Flanigan) 09/01/2020 1444   UROBILINOGEN 1.0 05/07/2015 2010   NITRITE NEGATIVE 09/01/2020 1444   LEUKOCYTESUR NEGATIVE 09/01/2020 1444  Radiological Exams on Admission: DG CHEST PORT 1 VIEW  Result Date: 09/01/2020 CLINICAL DATA:  Hemoptysis for 1 day, hypertension EXAM: PORTABLE CHEST 1 VIEW COMPARISON:  03/25/2019 FINDINGS: The heart size and mediastinal contours are within normal limits. Both lungs are clear. The visualized skeletal structures are unremarkable. IMPRESSION: No active disease. Electronically Signed   By: Randa Ngo M.D.   On: 09/01/2020 16:53    EKG: Independently reviewed. Sinus rhythm  Assessment/Plan Active Problems:   Hypokalemia   Nausea & vomiting  Recurrent Nausea and Vomiting  Hematemesis  Significant Weight Loss  Hx Lap Band 2010: recently noted recurrent nausea/vomiting, reflux.  She presented today after an episode of hematemesis.  ? Mallory weiss in setting of recurrent nausea and vomiting.  She denies melena or hematochezia. PPI BID, clear liquid diet, NPO at midnight GI c/s, appreciate recs - they'll see her  Weight Loss: significant weight loss over past 5 months, likely related to nausea/vomiting above GI eval as above  Wt Readings from Last 3 Encounters:  09/01/20 64 kg  04/01/20 73.3 kg  03/25/20 74.8 kg   Hypokalemia: replace and follow  AKI: 2/2 nausea, vomiting - stop HCTZ and ace - follow with IVF - UA with proteinuria, follow    Hx Lap Band: she's planning to follow up with surgery to have this removed  Hypertension: hold HCTZ and lisinopril given aki above, follow BP  Elevated Point Of Care beta hCG: follow serum   DVT prophylaxis: SCD  Code Status:   full  Family Communication:  None at bedside Disposition Plan:   Patient is from:  home  Anticipated DC to:  home  Anticipated DC date:  1-2 days  Anticipated DC barriers: Pending GI eval and recs  Consults called:  GI Admission status:  obs   Severity of Illness: The appropriate patient status for this patient is OBSERVATION. Observation status is judged to be reasonable and necessary in order to provide the required intensity of service to ensure the patient's safety. The patient's presenting symptoms, physical exam findings, and initial radiographic and laboratory data in the context of their medical condition is felt to place them at decreased risk for further clinical deterioration. Furthermore, it is anticipated that the patient will be medically stable for discharge from the hospital within 2 midnights of admission. The following factors support the patient status of observation.   " The patient's presenting symptoms include nausea, vomiting, hematemesis. " The initial radiographic and laboratory data are severe hypokalmiea.      Fayrene Helper MD [facility]  How to contact the [facility] Attending or Consulting provider [place] or covering provider during after hours [place], for this patient?   1. Check the care team in [facility] and look for Tavius Turgeon) attending/consulting TR[facility] provider listed and b) the [facility] team listed 2. Log into [email or web address] and use [place]'s universal password to access. If you do not have the password, please contact the hospital operator. 3. Locate the [facility] provider you are looking for under [facility] and page to Cynda Soule number that you can be directly reached. 4. If you still have difficulty reaching the provider, please page  the [facility] (Director on Call) for the Hospitalists listed on amion for assistance.  09/01/2020, 8:55 PM

## 2020-09-02 ENCOUNTER — Encounter (HOSPITAL_COMMUNITY): Payer: Self-pay | Admitting: Family Medicine

## 2020-09-02 ENCOUNTER — Encounter (HOSPITAL_COMMUNITY)
Admission: EM | Disposition: A | Discharge status: Home / Self Care | Payer: Self-pay | Source: Home / Self Care | Attending: Family Medicine

## 2020-09-02 DIAGNOSIS — N179 Acute kidney failure, unspecified: Secondary | ICD-10-CM | POA: Diagnosis present

## 2020-09-02 DIAGNOSIS — K2970 Gastritis, unspecified, without bleeding: Secondary | ICD-10-CM

## 2020-09-02 DIAGNOSIS — Z881 Allergy status to other antibiotic agents status: Secondary | ICD-10-CM | POA: Diagnosis not present

## 2020-09-02 DIAGNOSIS — Z880 Allergy status to penicillin: Secondary | ICD-10-CM | POA: Diagnosis not present

## 2020-09-02 DIAGNOSIS — Z86010 Personal history of colonic polyps: Secondary | ICD-10-CM | POA: Diagnosis not present

## 2020-09-02 DIAGNOSIS — R112 Nausea with vomiting, unspecified: Secondary | ICD-10-CM | POA: Diagnosis present

## 2020-09-02 DIAGNOSIS — K317 Polyp of stomach and duodenum: Secondary | ICD-10-CM

## 2020-09-02 DIAGNOSIS — R933 Abnormal findings on diagnostic imaging of other parts of digestive tract: Secondary | ICD-10-CM | POA: Diagnosis not present

## 2020-09-02 DIAGNOSIS — E876 Hypokalemia: Secondary | ICD-10-CM | POA: Diagnosis present

## 2020-09-02 DIAGNOSIS — Z79899 Other long term (current) drug therapy: Secondary | ICD-10-CM | POA: Diagnosis not present

## 2020-09-02 DIAGNOSIS — K2990 Gastroduodenitis, unspecified, without bleeding: Secondary | ICD-10-CM | POA: Diagnosis present

## 2020-09-02 DIAGNOSIS — Z9884 Bariatric surgery status: Secondary | ICD-10-CM | POA: Diagnosis not present

## 2020-09-02 DIAGNOSIS — K2100 Gastro-esophageal reflux disease with esophagitis, without bleeding: Secondary | ICD-10-CM

## 2020-09-02 DIAGNOSIS — Z780 Asymptomatic menopausal state: Secondary | ICD-10-CM | POA: Diagnosis not present

## 2020-09-02 DIAGNOSIS — K9509 Other complications of gastric band procedure: Secondary | ICD-10-CM | POA: Diagnosis present

## 2020-09-02 DIAGNOSIS — R1110 Vomiting, unspecified: Secondary | ICD-10-CM | POA: Diagnosis not present

## 2020-09-02 DIAGNOSIS — E860 Dehydration: Secondary | ICD-10-CM | POA: Diagnosis present

## 2020-09-02 DIAGNOSIS — K920 Hematemesis: Secondary | ICD-10-CM

## 2020-09-02 DIAGNOSIS — I10 Essential (primary) hypertension: Secondary | ICD-10-CM | POA: Diagnosis present

## 2020-09-02 DIAGNOSIS — D509 Iron deficiency anemia, unspecified: Secondary | ICD-10-CM | POA: Diagnosis present

## 2020-09-02 DIAGNOSIS — Z8249 Family history of ischemic heart disease and other diseases of the circulatory system: Secondary | ICD-10-CM | POA: Diagnosis not present

## 2020-09-02 DIAGNOSIS — Z20822 Contact with and (suspected) exposure to covid-19: Secondary | ICD-10-CM | POA: Diagnosis present

## 2020-09-02 DIAGNOSIS — Y831 Surgical operation with implant of artificial internal device as the cause of abnormal reaction of the patient, or of later complication, without mention of misadventure at the time of the procedure: Secondary | ICD-10-CM | POA: Diagnosis present

## 2020-09-02 HISTORY — DX: Acute kidney failure, unspecified: N17.9

## 2020-09-02 HISTORY — PX: BIOPSY: SHX5522

## 2020-09-02 HISTORY — PX: ESOPHAGOGASTRODUODENOSCOPY: SHX5428

## 2020-09-02 LAB — IRON AND TIBC
Iron: 67 ug/dL (ref 28–170)
Saturation Ratios: 28 % (ref 10.4–31.8)
TIBC: 237 ug/dL — ABNORMAL LOW (ref 250–450)
UIBC: 170 ug/dL

## 2020-09-02 LAB — CBC
HCT: 29.3 % — ABNORMAL LOW (ref 36.0–46.0)
Hemoglobin: 9.6 g/dL — ABNORMAL LOW (ref 12.0–15.0)
MCH: 27.2 pg (ref 26.0–34.0)
MCHC: 32.8 g/dL (ref 30.0–36.0)
MCV: 83 fL (ref 80.0–100.0)
Platelets: 157 10*3/uL (ref 150–400)
RBC: 3.53 MIL/uL — ABNORMAL LOW (ref 3.87–5.11)
RDW: 14.7 % (ref 11.5–15.5)
WBC: 3.2 10*3/uL — ABNORMAL LOW (ref 4.0–10.5)
nRBC: 0 % (ref 0.0–0.2)

## 2020-09-02 LAB — ABO/RH: ABO/RH(D): B POS

## 2020-09-02 LAB — HCG, QUANTITATIVE, PREGNANCY: hCG, Beta Chain, Quant, S: 7 m[IU]/mL — ABNORMAL HIGH (ref <5)

## 2020-09-02 LAB — COMPREHENSIVE METABOLIC PANEL
ALT: 11 U/L (ref 0–44)
AST: 13 U/L — ABNORMAL LOW (ref 15–41)
Albumin: 3.4 g/dL — ABNORMAL LOW (ref 3.5–5.0)
Alkaline Phosphatase: 29 U/L — ABNORMAL LOW (ref 38–126)
Anion gap: 8 (ref 5–15)
BUN: 24 mg/dL — ABNORMAL HIGH (ref 6–20)
CO2: 27 mmol/L (ref 22–32)
Calcium: 8.8 mg/dL — ABNORMAL LOW (ref 8.9–10.3)
Chloride: 105 mmol/L (ref 98–111)
Creatinine, Ser: 1.21 mg/dL — ABNORMAL HIGH (ref 0.44–1.00)
GFR, Estimated: 57 mL/min — ABNORMAL LOW (ref >60)
Glucose, Bld: 90 mg/dL (ref 70–99)
Potassium: 2.9 mmol/L — ABNORMAL LOW (ref 3.5–5.1)
Sodium: 140 mmol/L (ref 135–145)
Total Bilirubin: 1.5 mg/dL — ABNORMAL HIGH (ref 0.3–1.2)
Total Protein: 5.9 g/dL — ABNORMAL LOW (ref 6.5–8.1)

## 2020-09-02 LAB — HIV ANTIBODY (ROUTINE TESTING W REFLEX): HIV Screen 4th Generation wRfx: NONREACTIVE

## 2020-09-02 LAB — TYPE AND SCREEN
ABO/RH(D): B POS
Antibody Screen: NEGATIVE

## 2020-09-02 LAB — FERRITIN: Ferritin: 69 ng/mL (ref 11–307)

## 2020-09-02 LAB — HEMOGLOBIN AND HEMATOCRIT, BLOOD
HCT: 32.6 % — ABNORMAL LOW (ref 36.0–46.0)
Hemoglobin: 10.3 g/dL — ABNORMAL LOW (ref 12.0–15.0)

## 2020-09-02 SURGERY — EGD (ESOPHAGOGASTRODUODENOSCOPY)
Anesthesia: Moderate Sedation

## 2020-09-02 MED ORDER — FENTANYL CITRATE (PF) 100 MCG/2ML IJ SOLN
INTRAMUSCULAR | Status: DC | PRN
  Administered 2020-09-02 (×4): 25 ug via INTRAVENOUS

## 2020-09-02 MED ORDER — DIPHENHYDRAMINE HCL 50 MG/ML IJ SOLN
INTRAMUSCULAR | Status: DC | PRN
  Administered 2020-09-02: 25 mg via INTRAVENOUS

## 2020-09-02 MED ORDER — MIDAZOLAM HCL (PF) 10 MG/2ML IJ SOLN
INTRAMUSCULAR | Status: DC | PRN
  Administered 2020-09-02: 1 mg via INTRAVENOUS
  Administered 2020-09-02: 2 mg via INTRAVENOUS
  Administered 2020-09-02 (×3): 1 mg via INTRAVENOUS

## 2020-09-02 MED ORDER — DIPHENHYDRAMINE HCL 50 MG/ML IJ SOLN
INTRAMUSCULAR | Status: AC
  Filled 2020-09-02: qty 1

## 2020-09-02 MED ORDER — FENTANYL CITRATE (PF) 100 MCG/2ML IJ SOLN
INTRAMUSCULAR | Status: AC
  Filled 2020-09-02: qty 4

## 2020-09-02 MED ORDER — BUTAMBEN-TETRACAINE-BENZOCAINE 2-2-14 % EX AERO
INHALATION_SPRAY | CUTANEOUS | Status: DC | PRN
  Administered 2020-09-02: 2 via TOPICAL

## 2020-09-02 MED ORDER — POTASSIUM CHLORIDE 2 MEQ/ML IV SOLN
INTRAVENOUS | Status: AC
  Filled 2020-09-02 (×3): qty 1000

## 2020-09-02 MED ORDER — MIDAZOLAM HCL (PF) 5 MG/ML IJ SOLN
INTRAMUSCULAR | Status: AC
  Filled 2020-09-02: qty 2

## 2020-09-02 MED ORDER — MIDAZOLAM HCL 5 MG/5ML IJ SOLN
INTRAMUSCULAR | Status: DC | PRN
  Administered 2020-09-02 (×2): 1 mg via INTRAVENOUS

## 2020-09-02 NOTE — Op Note (Addendum)
[facility] Patient Name: Anna Zimmerman Procedure Date: 09/02/2020 MRN: [id number] Attending MD: Carlota Raspberry. Havery Moros , MD Date of Birth: 1976/01/22 CSN: [id number] Age: 44 Admit Type: Outpatient Procedure:                Upper GI endoscopy Indications:              Nausea with vomiting, history of gastric lap band Providers:                Benetta Spar, Technician, Ladona Ridgel,                            Technician, Carlota Raspberry. Havery Moros, MD, Wynonia Sours,                            RN Referring MD:              Medicines:                Fentanyl 100 micrograms IV, Midazolam 8 mg IV,                            Diphenhydramine 25 mg IV Complications:            No immediate complications. Estimated blood loss:                            Minimal. Estimated Blood Loss:     Estimated blood loss was minimal. Procedure:                Pre-Anesthesia Assessment:                           - Prior to the procedure, a History and Physical                            was performed, and patient medications and                            allergies were reviewed. The patient's tolerance of                            previous anesthesia was also reviewed. The risks                            and benefits of the procedure and the sedation                            options and risks were discussed with the patient.                            All questions were answered, and informed consent                            was obtained. Prior Anticoagulants: The patient has  taken no previous anticoagulant or antiplatelet                            agents. ASA Grade Assessment: III - A patient with                            severe systemic disease. After reviewing the risks                            and benefits, the patient was deemed in                            satisfactory condition to undergo the procedure.                           After obtaining  informed consent, the endoscope was                            passed under direct vision. Throughout the                            procedure, the patient's blood pressure, pulse, and                            oxygen saturations were monitored continuously. The                            GIF-H190 ([id number]) Olympus gastroscope was                            introduced through the mouth, and advanced to the                            second part of duodenum. The upper GI endoscopy was                            accomplished without difficulty. The patient                            tolerated the procedure well. Scope In: Scope Out: Findings:      Esophagogastric landmarks were identified: the Z-line was found at 43       cm, the gastroesophageal junction was found at 43 cm and the upper       extent of the gastric folds was found at 43 cm from the incisors.      Moderate esophagitis was found in the distal esophagus.      The exam of the esophagus was otherwise normal.      Evidence of a gastric lap band was found. A gastric pouch was found       containing retained food debris and pill debris and fluid, evidence of       poor clearance of the gastric pouch. The band lumen was at a very       angulated and tight turn about 1-2 cm below the GEJ. The gastric pouch  was lavaged and cleared, no pathology there.      Multiple small sessile polyps were found in the gastric body. Two       representative polyps were removed with a cold biopsy forceps. Resection       and retrieval were complete.      The exam of the stomach was otherwise slightly erythematous but no focal       ulcers / erosions.      Biopsies were taken with a cold forceps in the gastric body, at the       incisura and in the gastric antrum for Helicobacter pylori testing.      The duodenal bulb and second portion of the duodenum were normal. Impression:               - Esophagogastric landmarks identified.                            - Moderate esophagitis.                           - Normal esophagus otherwise                           - Gastric lap band appears tight and lumen /                            entrance into distal stomach is quite angulated                            with difficulty traversing it endoscopically.                            Gastric pouch is was filled with retained food                            debris / pills / secretions which was cleared.                           - Multiple benign appearing gastric polyps.                            Representative sample resected and retrieved.                           - Mild gastric erythema - biopsies taken to rule                            out H pylori                           - Normal duodenum                           Gastric lap band appears to be the cause of the                            patient's symptoms. Unclear if  this has migrated or                            herniated causing endoscopic findings. She would                            benefit from lap band removal or deflation given                            persistent longstanding symptoms / weight loss,                            inability to tolerate much PO. Moderate Sedation:      Moderate (conscious) sedation was administered by the endoscopy nurse       and supervised by the endoscopist. The patient's oxygen saturation,       heart rate, blood pressure and response to care were monitored. Total       physician intraservice time was 27 minutes. Recommendation:           - Return patient to hospital ward for ongoing care.                           - Clear liquid diet (patient states she has been                            able to tolerated clear liquids since admission).                           - Continue present medications.                           - Continue antiemetics as needed                           - Await pathology results.                           -  General surgery consult                           - Will obtain upper GI series to further                            characterize lap band anatomy Procedure Code(s):        --- Professional ---                           [id number], Esophagogastroduodenoscopy, flexible,                            transoral; with biopsy, single or multiple                           99152, 59, Moderate sedation services provided by  the same physician or other qualified health care                            professional performing the diagnostic or                            therapeutic service that the sedation supports,                            requiring the presence of an independent trained                            observer to assist in the monitoring of the                            patient's level of consciousness and physiological                            status; initial 15 minutes of intraservice time,                            patient age 61 years or older                           [phone], Moderate sedation; each additional 15                            minutes intraservice time Diagnosis Code(s):        --- Professional ---                           K21.00, Gastro-esophageal reflux disease with                            esophagitis, without bleeding                           Z98.84, Bariatric surgery status                           K31.7, Polyp of stomach and duodenum                           R11.2, Nausea with vomiting, unspecified CPT copyright 2019 American Medical Association. All rights reserved. The codes documented in this report are preliminary and upon coder review may  be revised to meet current compliance requirements. Remo Lipps P. Kimoni Pagliarulo, MD 09/02/2020 5:03:10 PM This report has been signed electronically. Number of Addenda: 0

## 2020-09-02 NOTE — H&P (View-Only) (Signed)
Referring Provider:  [facility]         Primary Care Physician:  [name], NP-C Primary Gastroenterologist:   Scarlette Shorts, MD            We were asked to see this patient for:    Nausea, vomiting, small amount of hematemesis            ASSESSMENT / PLAN:    # 44 yo female with remote lap gastric banding with progressive nausea / vomiting over the last 1.5 years associated with documented weight loss of significant weight loss. She doesn't think there is much fluid, if any, left in port. Rule out PUD in setting of frequent NSAID use.  --Patient will need EGD. The risks and benefits of EGD were discussed and the patient agrees to proceed. Timing of procedure to be announced, keep NPO for now.  --Continue BID IV PPI --Check iron studies, may need dose of IV iron  # Small amount of hematemesis yesterday possibly secondary to esophagitis, maybe a Mallory Weiss tear. Decline in hgb today is most likely dilutional --Further evaluation at time of EGD  # Chronic GERD with chest pressure and frequent belching despite daily Omeprazole.  --further evaluation at time of EGD  # Elevated HCG at 8.4. Discussed with [facility], this is low level secreted from pituitary, not pregnant.   # Chronic anemia.  Hemoglobin stable at 12.3 on admission yesterday, down to 9.6 today but probably component of hemodilution. She is on chronic iron EGD / colonoscopy [place] in December 2020 for IDA with findings of only esophagitis, small colon polyp. Duodenal biopsies negative for celiac  # AKI.  Creatinine improving 1.55 >> 1.21  # Hypokalemia, potassium 2.9 today. Repletion in progress  # HTN, BP elevated yesterday but normal today   # History of small sessile serrated colon polyp February 2020    HPI:                                                                                                                             Chief Complaint: Nausea, vomiting, blood in emesis  Anna Zimmerman is a 44  y.o. female with a pmh significant for, not necessarily limited to: Hypertension, iron deficiency anemia, GERD, lap gastric banding, colon polyps, right oophorectomy  Patient presented to the ED yesterday with complaints of vomiting with blood.  She is status post lap band surgery 13 years ago in [place], has had problems with "indigestion " since then.  She describes indigestion as belching , reflux, nausea / vomiting. After lap band her symptoms were more intermittent. She had some fluid removed from her port ~ 2012 but hasn't seen a Psychologist, sport and exercise since. Over the 1.5 years the nausea and vomiting has gotten worse. She frequently vomits after meals. Feels like food is not being digested. No dysphagia. She describes chest pressure and frequent belching despite taking daily Omeprazole for over a year. Yesterday she vomited a  small amount of blood.  She reports a 30 pound weight loss over the last few months. She has a documented 20 pound loss since June 2021.  She takes Ibuprofen on a regular basis. No black stools. She has chronic, intermittent loose stools.    Data Review:  Negative chest x-ray yesterday hCG elevated at 8.4 WBC 3.2, hemoglobin 9.6, MCV 83, platelets 157 Ferritin 77, TIBC 260, iron saturation 13% Potassium 2.9 Creatinine 1.55 >> 1.21 Lipase 34 Total bili 1.5, remainder of liver test unremarkable  PREVIOUS ENDOSCOPIC EVALUATIONS / PERTINENT STUDIES   Feb 2020 colonoscopy for iron deficiency anemia -One 2 mm polyp in the sigmoid colon, removed with a cold snare. Resected and retrieved. - The examination was otherwise normal on direct and retroflexion views.  February 2020 EGD for iron deficiency anemia and dyspepsia -Reflux esophagitis. - Status post lap band procedure. - Normal examined duodenum. Biopsied.  Surgical [P], sigmoid colon, polyp - SESSILE SERRATED POLYP WITH CONCURRENT BENIGN SPINDLE CELL PROLIFERATION IN THE LAMINA PROPRIA, CONSISTENT WITH A PERINEURIOMA. - NEGATIVE  FOR CYTOLOGIC DYSPLASIA. 2. Surgical [P], duodenum - DUODENAL MUCOSA WITH NO SPECIFIC HISTOPATHOLOGIC CHANGES. - NEGATIVE FOR HIGH GRADE DYSPLASIA OR MALIGNANCY.    Past Medical History:  Diagnosis Date  . Anemia   . Early menopause   . Hypertension     Past Surgical History:  Procedure Laterality Date  . LAPAROSCOPIC GASTRIC BANDING    . OVARIAN CYST REMOVAL    . right oopherectomy      Prior to Admission medications   Medication Sig Start Date End Date Taking? Authorizing Provider  acetaminophen (TYLENOL) 500 MG tablet Take 1,000 mg by mouth every 8 (eight) hours as needed for mild pain or headache.   Yes [provider]  docusate sodium (COLACE) 100 MG capsule Take 1 capsule (100 mg total) by mouth 2 (two) times daily. Patient taking differently: Take 100 mg by mouth daily.  02/28/19  Yes Henson, Vickie L, NP-C  ferrous gluconate (FERGON) 324 MG tablet TAKE 1 TABLET(324 MG) BY MOUTH THREE TIMES DAILY WITH MEALS Patient taking differently: Take 324 mg by mouth daily with breakfast.  04/27/20  Yes Henson, Vickie L, NP-C  hydrochlorothiazide (HYDRODIURIL) 25 MG tablet TAKE 1 TABLET(25 MG) BY MOUTH DAILY Patient taking differently: Take 25 mg by mouth daily.  04/27/20  Yes Henson, Vickie L, NP-C  lisinopril (ZESTRIL) 40 MG tablet TAKE 1 TABLET(40 MG) BY MOUTH DAILY Patient taking differently: Take 40 mg by mouth daily. TAKE 1 TABLET(40 MG) BY MOUTH DAILY 04/27/20  Yes Henson, Vickie L, NP-C  omeprazole (PRILOSEC) 20 MG capsule TAKE 1 CAPSULE(20 MG) BY MOUTH DAILY Patient taking differently: Take 20 mg by mouth daily.  04/27/20  Yes Henson, Vickie L, NP-C  Vitamin D, Ergocalciferol, (DRISDOL) 1.25 MG (50000 UNIT) CAPS capsule Take 1 capsule (50,000 Units total) by mouth every 7 (seven) days. Patient not taking: Reported on 09/01/2020 03/26/20   [name], NP-C    Current Facility-Administered Medications  Medication Dose Route Frequency Provider Last Rate Last Admin  .  acetaminophen (TYLENOL) tablet 650 mg  650 mg Oral Q6H PRN Elodia Florence., MD       Or  . acetaminophen (TYLENOL) suppository 650 mg  650 mg Rectal Q6H PRN Elodia Florence., MD      . hydrALAZINE (APRESOLINE) injection 10 mg  10 mg Intravenous Q4H PRN Sharion Settler, NP   10 mg at 09/01/20 2025  . lactated ringers 1,000  mL with potassium chloride 40 mEq infusion   Intravenous Continuous Elodia Florence., MD 100 mL/hr at 09/02/20 0549 New Bag at 09/02/20 0549  . pantoprazole (PROTONIX) injection 40 mg  40 mg Intravenous Q12H Elodia Florence., MD   40 mg at 09/01/20 2028    Allergies as of 09/01/2020 - Review Complete 09/01/2020  Allergen Reaction Noted  . Azithromycin  07/30/2016  . Penicillins  07/30/2016    Family History  Problem Relation Age of Onset  . Hypertension Father   . Aneurysm Father   . Heart disease Maternal Grandmother   . Kidney disease Maternal Grandmother   . Breast cancer Other        great grandmother  . Colon cancer Neg Hx   . Stomach cancer Neg Hx   . Pancreatic cancer Neg Hx     Social History   Socioeconomic History  . Marital status: Single    Spouse name: Not on file  . Number of children: Not on file  . Years of education: Not on file  . Highest education level: Not on file  Occupational History  . Not on file  Tobacco Use  . Smoking status: Never Smoker  . Smokeless tobacco: Never Used  Vaping Use  . Vaping Use: Never used  Substance and Sexual Activity  . Alcohol use: No  . Drug use: No  . Sexual activity: Not Currently  Other Topics Concern  . Not on file  Social History Narrative  . Not on file   Social Determinants of Health   Financial Resource Strain:   . Difficulty of Paying Living Expenses: Not on file  Food Insecurity:   . Worried About Charity fundraiser in the Last Year: Not on file  . Ran Out of Food in the Last Year: Not on file  Transportation Needs:   . Lack of Transportation (Medical):  Not on file  . Lack of Transportation (Non-Medical): Not on file  Physical Activity:   . Days of Exercise per Week: Not on file  . Minutes of Exercise per Session: Not on file  Stress:   . Feeling of Stress : Not on file  Social Connections:   . Frequency of Communication with Friends and Family: Not on file  . Frequency of Social Gatherings with Friends and Family: Not on file  . Attends Religious Services: Not on file  . Active Member of Clubs or Organizations: Not on file  . Attends Archivist Meetings: Not on file  . Marital Status: Not on file  Intimate Partner Violence:   . Fear of Current or Ex-Partner: Not on file  . Emotionally Abused: Not on file  . Physically Abused: Not on file  . Sexually Abused: Not on file    Review of Systems: All systems reviewed and negative except where noted in HPI.  OBJECTIVE:    Physical Exam: Vital signs in last 24 hours: Temp:  [97.6 F (36.4 C)-98.8 F (37.1 C)] 98.6 F (37 C) (12/01 7342) Pulse Rate:  [70-123] 72 (12/01 0632) Resp:  [12-20] 16 (12/01 8768) BP: (110-150)/(71-116) 113/76 (12/01 1157) SpO2:  [99 %-100 %] 100 % (12/01 2620) Weight:  [35 kg-65 kg] 65 kg (12/01 0500) Last BM Date: 08/31/20 (per pt report) General:   Alert, well-developed,  female in NAD Psych:  Pleasant, cooperative. Normal mood and affect. Eyes:  Pupils equal, sclera clear, no icterus.   Conjunctiva pink. Ears:  Normal auditory acuity. Nose:  No  deformity, discharge,  or lesions. Neck:  Supple; no masses Lungs:  Clear throughout to auscultation.   No wheezes, crackles, or rhonchi.  Heart:  Regular rate and rhythm; no lower extremity edema Abdomen:  Soft, non-distended, nontender, BS active, no palp mass   Rectal:  Deferred  Msk:  Symmetrical without gross deformities. . Neurologic:  Alert and  oriented x4;  grossly normal neurologically. Skin:  Intact without significant lesions or rashes.  Filed Weights   09/01/20 1832 09/02/20  0500  Weight: 64 kg 65 kg     Scheduled inpatient medications . pantoprazole (PROTONIX) IV  40 mg Intravenous Q12H      Intake/Output from previous day: 11/30 0701 - 12/01 0700 In: 2218.3 [I.V.:1039.3; IV Piggyback:1179.1] Out: -  Intake/Output this shift: No intake/output data recorded.   Lab Results: Recent Labs    09/01/20 1319 09/02/20 0503  WBC 5.7 3.2*  HGB 12.3 9.6*  HCT 37.8 29.3*  PLT 252 157   BMET Recent Labs    09/01/20 1319 09/02/20 0503  NA 137 140  K 2.4* 2.9*  CL 98 105  CO2 29 27  GLUCOSE 129* 90  BUN 35* 24*  CREATININE 1.55* 1.21*  CALCIUM 9.7 8.8*   LFT Recent Labs    09/02/20 0503  PROT 5.9*  ALBUMIN 3.4*  AST 13*  ALT 11  ALKPHOS 29*  BILITOT 1.5*   PT/INR No results for input(s): LABPROT, INR in the last 72 hours. Hepatitis Panel No results for input(s): HEPBSAG, HCVAB, HEPAIGM, HEPBIGM in the last 72 hours.   . CBC Latest Ref Rng & Units 09/02/2020 09/01/2020 03/25/2020  WBC 4.0 - 10.5 K/uL 3.2(L) 5.7 4.1  Hemoglobin 12.0 - 15.0 g/dL 9.6(L) 12.3 11.8  Hematocrit 36 - 46 % 29.3(L) 37.8 36.0  Platelets 150 - 400 K/uL 157 252 338    . CMP Latest Ref Rng & Units 09/02/2020 09/01/2020 03/25/2020  Glucose 70 - 99 mg/dL 90 129(H) 97  BUN 6 - 20 mg/dL 24(H) 35(H) 16  Creatinine 0.44 - 1.00 mg/dL 1.21(H) 1.55(H) 0.98  Sodium 135 - 145 mmol/L 140 137 140  Potassium 3.5 - 5.1 mmol/L 2.9(L) 2.4(LL) 3.7  Chloride 98 - 111 mmol/L 105 98 100  CO2 22 - 32 mmol/L 27 29 27   Calcium 8.9 - 10.3 mg/dL 8.8(L) 9.7 9.9  Total Protein 6.5 - 8.1 g/dL 5.9(L) 7.7 7.7  Total Bilirubin 0.3 - 1.2 mg/dL 1.5(H) 1.2 0.6  Alkaline Phos 38 - 126 U/L 29(L) 39 58  AST 15 - 41 U/L 13(L) 20 12  ALT 0 - 44 U/L 11 16 9    Studies/Results: DG CHEST PORT 1 VIEW  Result Date: 09/01/2020 CLINICAL DATA:  Hemoptysis for 1 day, hypertension EXAM: PORTABLE CHEST 1 VIEW COMPARISON:  03/25/2019 FINDINGS: The heart size and mediastinal contours are within normal  limits. Both lungs are clear. The visualized skeletal structures are unremarkable. IMPRESSION: No active disease. Electronically Signed   By: Randa Ngo M.D.   On: 09/01/2020 16:53    Active Problems:   Hypokalemia   Nausea & vomiting   Hematemesis    Tye Savoy, NP-C @  09/02/2020, 8:49 AM

## 2020-09-02 NOTE — Consult Note (Addendum)
Referring Provider:  [facility]         Primary Care Physician:  [name], NP-C Primary Gastroenterologist:   Scarlette Shorts, MD            We were asked to see this patient for:    Nausea, vomiting, small amount of hematemesis            ASSESSMENT / PLAN:    # 44 yo female with remote lap gastric banding with progressive nausea / vomiting over the last 1.5 years associated with documented weight loss of significant weight loss. She doesn't think there is much fluid, if any, left in port. Rule out PUD in setting of frequent NSAID use.  --Patient will need EGD. The risks and benefits of EGD were discussed and the patient agrees to proceed. Timing of procedure to be announced, keep NPO for now.  --Continue BID IV PPI --Check iron studies, may need dose of IV iron  # Small amount of hematemesis yesterday possibly secondary to esophagitis, maybe a Mallory Weiss tear. Decline in hgb today is most likely dilutional --Further evaluation at time of EGD  # Chronic GERD with chest pressure and frequent belching despite daily Omeprazole.  --further evaluation at time of EGD  # Elevated HCG at 8.4. Discussed with TR[facility], this is low level secreted from pituitary, not pregnant.   # Chronic anemia.  Hemoglobin stable at 12.3 on admission yesterday, down to 9.6 today but probably component of hemodilution. She is on chronic iron EGD / colonoscopy [place] in December 2020 for IDA with findings of only esophagitis, small colon polyp. Duodenal biopsies negative for celiac  # AKI.  Creatinine improving 1.55 >> 1.21  # Hypokalemia, potassium 2.9 today. Repletion in progress  # HTN, BP elevated yesterday but normal today   # History of small sessile serrated colon polyp February 2020    HPI:                                                                                                                             Chief Complaint: Nausea, vomiting, blood in emesis  Anna Zimmerman is a 43  y.o. female with a pmh significant for, not necessarily limited to: Hypertension, iron deficiency anemia, GERD, lap gastric banding, colon polyps, right oophorectomy  Patient presented to the ED yesterday with complaints of vomiting with blood.  She is status post lap band surgery 13 years ago in [place], has had problems with "indigestion " since then.  She describes indigestion as belching , reflux, nausea / vomiting. After lap band her symptoms were more intermittent. She had some fluid removed from her port ~ 2012 but hasn't seen a Psychologist, sport and exercise since. Over the 1.5 years the nausea and vomiting has gotten worse. She frequently vomits after meals. Feels like food is not being digested. No dysphagia. She describes chest pressure and frequent belching despite taking daily Omeprazole for over a year. Yesterday she vomited a  small amount of blood.  She reports a 30 pound weight loss over the last few months. She has a documented 20 pound loss since June 2021.  She takes Ibuprofen on a regular basis. No black stools. She has chronic, intermittent loose stools.    Data Review:  Negative chest x-ray yesterday hCG elevated at 8.4 WBC 3.2, hemoglobin 9.6, MCV 83, platelets 157 Ferritin 77, TIBC 260, iron saturation 13% Potassium 2.9 Creatinine 1.55 >> 1.21 Lipase 34 Total bili 1.5, remainder of liver test unremarkable  PREVIOUS ENDOSCOPIC EVALUATIONS / PERTINENT STUDIES   Feb 2020 colonoscopy for iron deficiency anemia -One 2 mm polyp in the sigmoid colon, removed with a cold snare. Resected and retrieved. - The examination was otherwise normal on direct and retroflexion views.  February 2020 EGD for iron deficiency anemia and dyspepsia -Reflux esophagitis. - Status post lap band procedure. - Normal examined duodenum. Biopsied.  Surgical [P], sigmoid colon, polyp - SESSILE SERRATED POLYP WITH CONCURRENT BENIGN SPINDLE CELL PROLIFERATION IN THE LAMINA PROPRIA, CONSISTENT WITH A PERINEURIOMA. - NEGATIVE  FOR CYTOLOGIC DYSPLASIA. 2. Surgical [P], duodenum - DUODENAL MUCOSA WITH NO SPECIFIC HISTOPATHOLOGIC CHANGES. - NEGATIVE FOR HIGH GRADE DYSPLASIA OR MALIGNANCY.    Past Medical History:  Diagnosis Date  . Anemia   . Early menopause   . Hypertension     Past Surgical History:  Procedure Laterality Date  . LAPAROSCOPIC GASTRIC BANDING    . OVARIAN CYST REMOVAL    . right oopherectomy      Prior to Admission medications   Medication Sig Start Date End Date Taking? Authorizing Provider  acetaminophen (TYLENOL) 500 MG tablet Take 1,000 mg by mouth every 8 (eight) hours as needed for mild pain or headache.   Yes [provider]  docusate sodium (COLACE) 100 MG capsule Take 1 capsule (100 mg total) by mouth 2 (two) times daily. Patient taking differently: Take 100 mg by mouth daily.  02/28/19  Yes Henson, Vickie L, NP-C  ferrous gluconate (FERGON) 324 MG tablet TAKE 1 TABLET(324 MG) BY MOUTH THREE TIMES DAILY WITH MEALS Patient taking differently: Take 324 mg by mouth daily with breakfast.  04/27/20  Yes Henson, Vickie L, NP-C  hydrochlorothiazide (HYDRODIURIL) 25 MG tablet TAKE 1 TABLET(25 MG) BY MOUTH DAILY Patient taking differently: Take 25 mg by mouth daily.  04/27/20  Yes Henson, Vickie L, NP-C  lisinopril (ZESTRIL) 40 MG tablet TAKE 1 TABLET(40 MG) BY MOUTH DAILY Patient taking differently: Take 40 mg by mouth daily. TAKE 1 TABLET(40 MG) BY MOUTH DAILY 04/27/20  Yes Henson, Vickie L, NP-C  omeprazole (PRILOSEC) 20 MG capsule TAKE 1 CAPSULE(20 MG) BY MOUTH DAILY Patient taking differently: Take 20 mg by mouth daily.  04/27/20  Yes Henson, Vickie L, NP-C  Vitamin D, Ergocalciferol, (DRISDOL) 1.25 MG (50000 UNIT) CAPS capsule Take 1 capsule (50,000 Units total) by mouth every 7 (seven) days. Patient not taking: Reported on 09/01/2020 03/26/20   [name], NP-C    Current Facility-Administered Medications  Medication Dose Route Frequency Provider Last Rate Last Admin  .  acetaminophen (TYLENOL) tablet 650 mg  650 mg Oral Q6H PRN Elodia Florence., MD       Or  . acetaminophen (TYLENOL) suppository 650 mg  650 mg Rectal Q6H PRN Elodia Florence., MD      . hydrALAZINE (APRESOLINE) injection 10 mg  10 mg Intravenous Q4H PRN Sharion Settler, NP   10 mg at 09/01/20 2025  . lactated ringers 1,000  mL with potassium chloride 40 mEq infusion   Intravenous Continuous Elodia Florence., MD 100 mL/hr at 09/02/20 0549 New Bag at 09/02/20 0549  . pantoprazole (PROTONIX) injection 40 mg  40 mg Intravenous Q12H Elodia Florence., MD   40 mg at 09/01/20 2028    Allergies as of 09/01/2020 - Review Complete 09/01/2020  Allergen Reaction Noted  . Azithromycin  07/30/2016  . Penicillins  07/30/2016    Family History  Problem Relation Age of Onset  . Hypertension Father   . Aneurysm Father   . Heart disease Maternal Grandmother   . Kidney disease Maternal Grandmother   . Breast cancer Other        great grandmother  . Colon cancer Neg Hx   . Stomach cancer Neg Hx   . Pancreatic cancer Neg Hx     Social History   Socioeconomic History  . Marital status: Single    Spouse name: Not on file  . Number of children: Not on file  . Years of education: Not on file  . Highest education level: Not on file  Occupational History  . Not on file  Tobacco Use  . Smoking status: Never Smoker  . Smokeless tobacco: Never Used  Vaping Use  . Vaping Use: Never used  Substance and Sexual Activity  . Alcohol use: No  . Drug use: No  . Sexual activity: Not Currently  Other Topics Concern  . Not on file  Social History Narrative  . Not on file   Social Determinants of Health   Financial Resource Strain:   . Difficulty of Paying Living Expenses: Not on file  Food Insecurity:   . Worried About Charity fundraiser in the Last Year: Not on file  . Ran Out of Food in the Last Year: Not on file  Transportation Needs:   . Lack of Transportation (Medical):  Not on file  . Lack of Transportation (Non-Medical): Not on file  Physical Activity:   . Days of Exercise per Week: Not on file  . Minutes of Exercise per Session: Not on file  Stress:   . Feeling of Stress : Not on file  Social Connections:   . Frequency of Communication with Friends and Family: Not on file  . Frequency of Social Gatherings with Friends and Family: Not on file  . Attends Religious Services: Not on file  . Active Member of Clubs or Organizations: Not on file  . Attends Archivist Meetings: Not on file  . Marital Status: Not on file  Intimate Partner Violence:   . Fear of Current or Ex-Partner: Not on file  . Emotionally Abused: Not on file  . Physically Abused: Not on file  . Sexually Abused: Not on file    Review of Systems: All systems reviewed and negative except where noted in HPI.  OBJECTIVE:    Physical Exam: Vital signs in last 24 hours: Temp:  [97.6 F (36.4 C)-98.8 F (37.1 C)] 98.6 F (37 C) (12/01 3532) Pulse Rate:  [70-123] 72 (12/01 0632) Resp:  [12-20] 16 (12/01 9924) BP: (110-150)/(71-116) 113/76 (12/01 2683) SpO2:  [99 %-100 %] 100 % (12/01 4196) Weight:  [22 kg-65 kg] 65 kg (12/01 0500) Last BM Date: 08/31/20 (per pt report) General:   Alert, well-developed,  female in NAD Psych:  Pleasant, cooperative. Normal mood and affect. Eyes:  Pupils equal, sclera clear, no icterus.   Conjunctiva pink. Ears:  Normal auditory acuity. Nose:  No  deformity, discharge,  or lesions. Neck:  Supple; no masses Lungs:  Clear throughout to auscultation.   No wheezes, crackles, or rhonchi.  Heart:  Regular rate and rhythm; no lower extremity edema Abdomen:  Soft, non-distended, nontender, BS active, no palp mass   Rectal:  Deferred  Msk:  Symmetrical without gross deformities. . Neurologic:  Alert and  oriented x4;  grossly normal neurologically. Skin:  Intact without significant lesions or rashes.  Filed Weights   09/01/20 1832 09/02/20  0500  Weight: 64 kg 65 kg     Scheduled inpatient medications . pantoprazole (PROTONIX) IV  40 mg Intravenous Q12H      Intake/Output from previous day: 11/30 0701 - 12/01 0700 In: 2218.3 [I.V.:1039.3; IV Piggyback:1179.1] Out: -  Intake/Output this shift: No intake/output data recorded.   Lab Results: Recent Labs    09/01/20 1319 09/02/20 0503  WBC 5.7 3.2*  HGB 12.3 9.6*  HCT 37.8 29.3*  PLT 252 157   BMET Recent Labs    09/01/20 1319 09/02/20 0503  NA 137 140  K 2.4* 2.9*  CL 98 105  CO2 29 27  GLUCOSE 129* 90  BUN 35* 24*  CREATININE 1.55* 1.21*  CALCIUM 9.7 8.8*   LFT Recent Labs    09/02/20 0503  PROT 5.9*  ALBUMIN 3.4*  AST 13*  ALT 11  ALKPHOS 29*  BILITOT 1.5*   PT/INR No results for input(s): LABPROT, INR in the last 72 hours. Hepatitis Panel No results for input(s): HEPBSAG, HCVAB, HEPAIGM, HEPBIGM in the last 72 hours.   . CBC Latest Ref Rng & Units 09/02/2020 09/01/2020 03/25/2020  WBC 4.0 - 10.5 K/uL 3.2(L) 5.7 4.1  Hemoglobin 12.0 - 15.0 g/dL 9.6(L) 12.3 11.8  Hematocrit 36 - 46 % 29.3(L) 37.8 36.0  Platelets 150 - 400 K/uL 157 252 338    . CMP Latest Ref Rng & Units 09/02/2020 09/01/2020 03/25/2020  Glucose 70 - 99 mg/dL 90 129(H) 97  BUN 6 - 20 mg/dL 24(H) 35(H) 16  Creatinine 0.44 - 1.00 mg/dL 1.21(H) 1.55(H) 0.98  Sodium 135 - 145 mmol/L 140 137 140  Potassium 3.5 - 5.1 mmol/L 2.9(L) 2.4(LL) 3.7  Chloride 98 - 111 mmol/L 105 98 100  CO2 22 - 32 mmol/L 27 29 27   Calcium 8.9 - 10.3 mg/dL 8.8(L) 9.7 9.9  Total Protein 6.5 - 8.1 g/dL 5.9(L) 7.7 7.7  Total Bilirubin 0.3 - 1.2 mg/dL 1.5(H) 1.2 0.6  Alkaline Phos 38 - 126 U/L 29(L) 39 58  AST 15 - 41 U/L 13(L) 20 12  ALT 0 - 44 U/L 11 16 9    Studies/Results: DG CHEST PORT 1 VIEW  Result Date: 09/01/2020 CLINICAL DATA:  Hemoptysis for 1 day, hypertension EXAM: PORTABLE CHEST 1 VIEW COMPARISON:  03/25/2019 FINDINGS: The heart size and mediastinal contours are within normal  limits. Both lungs are clear. The visualized skeletal structures are unremarkable. IMPRESSION: No active disease. Electronically Signed   By: Randa Ngo M.D.   On: 09/01/2020 16:53    Active Problems:   Hypokalemia   Nausea & vomiting   Hematemesis    Tye Savoy, NP-C @  09/02/2020, 8:49 AM

## 2020-09-02 NOTE — Progress Notes (Addendum)
PROGRESS NOTE  Anna Zimmerman  DPO:[id number] DOB: [birth year] DOA: 09/01/2020 PCP: [name], NP-C   Brief Narrative: Anna Zimmerman is a 44 y.o. female with a history of lap band in 2010, HTN, iron deficiency anemia, early menopause who presented 11/30 with nausea, vomiting, and a tablespoon of hematemesis. She's been taking poor per oral intake for months with plans to establish care with surgery in [place].   She was admitted, given potassium supplementation, PPI, clear liquid diet with subjective improvement. GI consulted and EGD is planned.  Assessment & Plan: Active Problems:   Hypokalemia   Recurrent vomiting   Hematemesis   Acute renal failure (HCC)  N/V/hematemesis, GERD: With hx NSAID use.  - EGD 12/1 per GI - Continue PPI. On PPI chronically. - Diet per GI, NPO pending EGD  Hypokalemia: Remains severe despite supplementation.  - Continue supplementation in IVF and monitoring with Mg in AM  History of lap band 2010, iron deficiency anemia, unintentional weight loss:  - Dietitian consulted - Follow up with surgery (inpt vs. outpt) ADDENDUM: Based on EGD findings, general surgery consult recommended by GI. Spoke with Dr. Zenia Resides for consultation. Appreciate recommendations.  Iron deficiency anemia:  - Continue monitoring CBC. With no further bleeding, decline in hgb suspected to be hemodilution in setting of significant dehydration and known anemia.  - Iron studies pending, may need IV iron. - H/H this PM to confirm stability  AKI: Improving with IVF.  - Continue IVF pending EGD  HTN:  - Holding lisinopril and HCTZ in light of dehydration and hypokalemia  History of early menopause: LMP 2016. No recent VB.  - Follow up with Gyn as outpatient. D/w Dr. [name] the mildly elevated hCG. This represents pituitary secretion.    DVT prophylaxis: SCDs Code Status: Full Family Communication: None at bedside Disposition Plan:  Status is: Inpatient  Remains  inpatient appropriate because:Ongoing diagnostic testing needed not appropriate for outpatient work up  Dispo: The patient is from: Home              Anticipated d/c is to: Home              Anticipated d/c date is: 1 day              Patient currently is not medically stable to d/c. Consultants:   GI  Procedures:   EGD 09/02/2020 Dr. Havery Moros  Antimicrobials:  None   Subjective: Nausea and vomiting improved since admission. No further bleeding.   Objective: Vitals:   09/02/20 0632 09/02/20 1235 09/02/20 1453 09/02/20 1615  BP: 113/76 117/85 (!) 165/105 129/72  Pulse: 72 62 77 82  Resp: 16 17 15 16   Temp: 98.6 F (37 C) 98.8 F (37.1 C) (!) 97 F (36.1 C)   TempSrc: Oral Oral Temporal   SpO2: 100% 100%  100%  Weight:   64.9 kg   Height:   5\' 9"  (1.753 m)     Intake/Output Summary (Last 24 hours) at 09/02/2020 1639 Last data filed at 09/02/2020 1500 Gross per 24 hour  Intake 2955.44 ml  Output -  Net 2955.44 ml   Filed Weights   09/01/20 1832 09/02/20 0500 09/02/20 1453  Weight: 64 kg 65 kg 64.9 kg    Gen: Thin female in no distress Pulm: Non-labored breathing. Clear to auscultation bilaterally.  CV: Regular rate and rhythm. No murmur, rub, or gallop. No JVD, no pitting pedal edema. GI: Abdomen soft, not tender. Palpable port in epigastrium. Non-distended, with normoactive  bowel sounds. No organomegaly or masses felt. Ext: Warm, no deformities Skin: No rashes, lesions or ulcers Neuro: Alert and oriented. No focal neurological deficits. Psych: Judgement and insight appear normal. Mood & affect appropriate.   Data Reviewed: I have personally reviewed following labs and imaging studies  CBC: Recent Labs  Lab 09/01/20 1319 09/02/20 0503  WBC 5.7 3.2*  HGB 12.3 9.6*  HCT 37.8 29.3*  MCV 81.8 83.0  PLT 252 381   Basic Metabolic Panel: Recent Labs  Lab 09/01/20 1319 09/02/20 0503  NA 137 140  K 2.4* 2.9*  CL 98 105  CO2 29 27  GLUCOSE 129* 90   BUN 35* 24*  CREATININE 1.55* 1.21*  CALCIUM 9.7 8.8*  MG 1.7  --    GFR: Estimated Creatinine Clearance: 61.4 mL/min (A) (by C-G formula based on SCr of 1.21 mg/dL (H)). Liver Function Tests: Recent Labs  Lab 09/01/20 1319 09/02/20 0503  AST 20 13*  ALT 16 11  ALKPHOS 39 29*  BILITOT 1.2 1.5*  PROT 7.7 5.9*  ALBUMIN 4.2 3.4*   Recent Labs  Lab 09/01/20 1319  LIPASE 34   No results for input(s): AMMONIA in the last 168 hours. Coagulation Profile: No results for input(s): INR, PROTIME in the last 168 hours. Cardiac Enzymes: No results for input(s): CKTOTAL, CKMB, CKMBINDEX, TROPONINI in the last 168 hours. BNP (last 3 results) No results for input(s): PROBNP in the last 8760 hours. HbA1C: No results for input(s): HGBA1C in the last 72 hours. CBG: No results for input(s): GLUCAP in the last 168 hours. Lipid Profile: No results for input(s): CHOL, HDL, LDLCALC, TRIG, CHOLHDL, LDLDIRECT in the last 72 hours. Thyroid Function Tests: No results for input(s): TSH, T4TOTAL, FREET4, T3FREE, THYROIDAB in the last 72 hours. Anemia Panel: Recent Labs    09/02/20 1109  FERRITIN 69  TIBC 237*  IRON 67   Urine analysis:    Component Value Date/Time   COLORURINE AMBER (A) 09/01/2020 1444   APPEARANCEUR CLEAR 09/01/2020 1444   LABSPEC 1.028 09/01/2020 1444   LABSPEC 1.015 07/05/2018 1404   PHURINE 5.0 09/01/2020 1444   GLUCOSEU NEGATIVE 09/01/2020 1444   HGBUR SMALL (A) 09/01/2020 1444   BILIRUBINUR NEGATIVE 09/01/2020 1444   BILIRUBINUR negative 07/05/2018 1404   KETONESUR 5 (A) 09/01/2020 1444   PROTEINUR 100 (A) 09/01/2020 1444   UROBILINOGEN 1.0 05/07/2015 2010   NITRITE NEGATIVE 09/01/2020 1444   LEUKOCYTESUR NEGATIVE 09/01/2020 1444   Recent Results (from the past 240 hour(s))  Resp Panel by RT-PCR (Flu A&B, Covid) Nasopharyngeal Swab     Status: None   Collection Time: 09/01/20  3:47 PM   Specimen: Nasopharyngeal Swab; Nasopharyngeal(NP) swabs in vial  transport medium  Result Value Ref Range Status   SARS Coronavirus 2 by RT PCR NEGATIVE NEGATIVE Final    Comment: (NOTE) SARS-CoV-2 target nucleic acids are NOT DETECTED.  The SARS-CoV-2 RNA is generally detectable in upper respiratory specimens during the acute phase of infection. The lowest concentration of SARS-CoV-2 viral copies this assay can detect is 138 copies/mL. A negative result does not preclude SARS-Cov-2 infection and should not be used as the sole basis for treatment or other patient management decisions. A negative result may occur with  improper specimen collection/handling, submission of specimen other than nasopharyngeal swab, presence of viral mutation(s) within the areas targeted by this assay, and inadequate number of viral copies(<138 copies/mL). A negative result must be combined with clinical observations, patient history, and epidemiological information. The  expected result is Negative.  Fact Sheet for Patients:  EntrepreneurPulse.com.au  Fact Sheet for Healthcare Providers:  IncredibleEmployment.be  This test is no t yet approved or cleared by the [place] FDA and  has been authorized for detection and/or diagnosis of SARS-CoV-2 by FDA under an Emergency Use Authorization (EUA). This EUA will remain  in effect (meaning this test can be used) for the duration of the COVID-19 declaration under Section 564(b)(1) of the Act, 21 U.S.C.section 360bbb-3(b)(1), unless the authorization is terminated  or revoked sooner.       Influenza A by PCR NEGATIVE NEGATIVE Final   Influenza B by PCR NEGATIVE NEGATIVE Final    Comment: (NOTE) The Xpert Xpress SARS-CoV-2/FLU/RSV plus assay is intended as an aid in the diagnosis of influenza from Nasopharyngeal swab specimens and should not be used as a sole basis for treatment. Nasal washings and aspirates are unacceptable for Xpert Xpress SARS-CoV-2/FLU/RSV testing.  Fact  Sheet for Patients: EntrepreneurPulse.com.au  Fact Sheet for Healthcare Providers: IncredibleEmployment.be  This test is not yet approved or cleared by the [place] FDA and has been authorized for detection and/or diagnosis of SARS-CoV-2 by FDA under an Emergency Use Authorization (EUA). This EUA will remain in effect (meaning this test can be used) for the duration of the COVID-19 declaration under Section 564(b)(1) of the Act, 21 U.S.C. section 360bbb-3(b)(1), unless the authorization is terminated or revoked.  Performed at [facility], [place] [address] [place], [place] [address]       Radiology Studies: DG CHEST PORT 1 VIEW  Result Date: 09/01/2020 CLINICAL DATA:  Hemoptysis for 1 day, hypertension EXAM: PORTABLE CHEST 1 VIEW COMPARISON:  03/25/2019 FINDINGS: The heart size and mediastinal contours are within normal limits. Both lungs are clear. The visualized skeletal structures are unremarkable. IMPRESSION: No active disease. Electronically Signed   By: Randa Ngo M.D.   On: 09/01/2020 16:53    Scheduled Meds: . [MAR Hold] pantoprazole (PROTONIX) IV  40 mg Intravenous Q12H   Continuous Infusions:   LOS: 0 days   Time spent: 35 minutes.  Patrecia Pour, MD [facility] [email or web address] 09/02/2020, 4:39 PM

## 2020-09-02 NOTE — Consult Note (Signed)
Anna Zimmerman Feb 10, 1976  [id number].    Requesting MD: Dr. Bonner Puna Chief Complaint/Reason for Consult: lap band removal  HPI:  Anna Zimmerman is a 44 yo female who had a lap gastric band placed in [place] in 2010. She moved to [place] [facility] [facility] in 2015, and reports that for the last few months she has had difficulty with oral intake, as well as weight loss. She cannot remember the last time the band was filled or had fluid removed, but it has been many years. For the last week she has had nausea and vomiting, and yesterday had some hematemesis. Denies abdominal pain. She presented to the ED and was admitted for workup and hydration. She has not had any hematemesis since admission and has been able to drink some clear liquids today. Today she underwent an EGD, which showed no ulcerations, ischemia, or erosion of the band, however the band appeared to have a very tight lumen and was difficult to traverse with the scope.   ROS: Review of Systems  Constitutional: Positive for malaise/fatigue and weight loss. Negative for chills and fever.  HENT: Negative for congestion.   Respiratory: Negative for shortness of breath and stridor.   Gastrointestinal: Positive for nausea and vomiting. Negative for abdominal pain.  Neurological: Negative for dizziness and speech change.    Family History  Problem Relation Age of Onset  . Hypertension Father   . Aneurysm Father   . Heart disease Maternal Grandmother   . Kidney disease Maternal Grandmother   . Breast cancer Other        great grandmother  . Colon cancer Neg Hx   . Stomach cancer Neg Hx   . Pancreatic cancer Neg Hx     Past Medical History:  Diagnosis Date  . Anemia   . Early menopause   . Hypertension     Past Surgical History:  Procedure Laterality Date  . LAPAROSCOPIC GASTRIC BANDING    . OVARIAN CYST REMOVAL    . right oopherectomy      Social History:  reports that she has never smoked. She has never used smokeless tobacco. She reports that  she does not drink alcohol and does not use drugs.  Allergies:  Allergies  Allergen Reactions  . Azithromycin     rash  . Penicillins     Rash Has patient had a PCN reaction causing immediate rash, facial/tongue/throat swelling, SOB or lightheadedness with hypotension: YES Has patient had a PCN reaction causing severe rash involving mucus membranes or skin necrosis:NO Has patient had a PCN reaction that required hospitalizationNO Has patient had a PCN reaction occurring within the last 10 years: NO If all of the above answers are "NO", then may proceed with Cephalosporin use.    Medications Prior to Admission  Medication Sig Dispense Refill  . acetaminophen (TYLENOL) 500 MG tablet Take 1,000 mg by mouth every 8 (eight) hours as needed for mild pain or headache.    . docusate sodium (COLACE) 100 MG capsule Take 1 capsule (100 mg total) by mouth 2 (two) times daily. (Patient taking differently: Take 100 mg by mouth daily. ) 10 capsule 0  . ferrous gluconate (FERGON) 324 MG tablet TAKE 1 TABLET(324 MG) BY MOUTH THREE TIMES DAILY WITH MEALS (Patient taking differently: Take 324 mg by mouth daily with breakfast. ) 90 tablet 4  . hydrochlorothiazide (HYDRODIURIL) 25 MG tablet TAKE 1 TABLET(25 MG) BY MOUTH DAILY (Patient taking differently: Take 25 mg by mouth daily. ) 30 tablet 4  .  lisinopril (ZESTRIL) 40 MG tablet TAKE 1 TABLET(40 MG) BY MOUTH DAILY (Patient taking differently: Take 40 mg by mouth daily. TAKE 1 TABLET(40 MG) BY MOUTH DAILY) 30 tablet 4  . omeprazole (PRILOSEC) 20 MG capsule TAKE 1 CAPSULE(20 MG) BY MOUTH DAILY (Patient taking differently: Take 20 mg by mouth daily. ) 30 capsule 4  . Vitamin D, Ergocalciferol, (DRISDOL) 1.25 MG (50000 UNIT) CAPS capsule Take 1 capsule (50,000 Units total) by mouth every 7 (seven) days. (Patient not taking: Reported on 09/01/2020) 12 capsule 0     Physical Exam: Blood pressure 115/76, pulse 83, temperature (!) 97 F (36.1 C), temperature  source Temporal, resp. rate 16, height 5\' 9"  (1.753 m), weight 64.9 kg, last menstrual period 12/16/2014, SpO2 100 %. General: resting comfortably, appears stated age, no apparent distress Neurological: alert and oriented, no focal deficits, cranial nerves grossly in tact HEENT: normocephalic, atraumatic, oropharynx clear, no scleral icterus CV: extremities warm and well-perfused Respiratory: normal work of breathing, symmetric chest wall expansion Abdomen: soft, nondistended, nontender to deep palpation. No masses or organomegaly. Gastric band reservoir palpable in the left upper quadrant. Well-healed port site scars.  Extremities: warm and well-perfused, no deformities, moving all extremities spontaneously Psychiatric: normal mood and affect Skin: warm and dry, no jaundice, no rashes or lesions   Results for orders placed or performed during the hospital encounter of 09/01/20 (from the past 48 hour(s))  Lipase, blood     Status: None   Collection Time: 09/01/20  1:19 PM  Result Value Ref [place]   Lipase 34 11 - 51 U/L    Comment: Performed at [facility], [place] [address], [place], [place] [address]  Comprehensive metabolic panel     Status: Abnormal   Collection Time: 09/01/20  1:19 PM  Result Value Ref [place]   Sodium 137 135 - 145 mmol/L   Potassium 2.4 (LL) 3.5 - 5.1 mmol/L    Comment: CRITICAL RESULT CALLED TO, READ BACK BY AND VERIFIED WITH: S.WEST AT 1434 ON 09/01/20 BY N.THOMPSON    Chloride 98 98 - 111 mmol/L   CO2 29 22 - 32 mmol/L   Glucose, Bld 129 (H) 70 - 99 mg/dL    Comment: Glucose reference range applies only to samples taken after fasting for at least 8 hours.   BUN 35 (H) 6 - 20 mg/dL   Creatinine, Ser 1.55 (H) 0.44 - 1.00 mg/dL   Calcium 9.7 8.9 - 10.3 mg/dL   Total Protein 7.7 6.5 - 8.1 g/dL   Albumin 4.2 3.5 - 5.0 g/dL   AST 20 15 - 41 U/L   ALT 16 0 - 44 U/L   Alkaline Phosphatase 39 38 - 126 U/L   Total Bilirubin 1.2 0.3 - 1.2 mg/dL    GFR, Estimated 42 (L) >60 mL/min    Comment: (NOTE) Calculated using the CKD-EPI Creatinine Equation (2021)    Anion gap 10 5 - 15    Comment: Performed at [facility], [place] [address], [place], [place] [address]  CBC     Status: None   Collection Time: 09/01/20  1:19 PM  Result Value Ref [place]   WBC 5.7 4.0 - 10.5 K/uL   RBC 4.62 3.87 - 5.11 MIL/uL   Hemoglobin 12.3 12.0 - 15.0 g/dL   HCT 37.8 36 - 46 %   MCV 81.8 80.0 - 100.0 fL   MCH 26.6 26.0 - 34.0 pg   MCHC 32.5 30.0 - 36.0 g/dL  RDW 14.5 11.5 - 15.5 %   Platelets 252 150 - 400 K/uL   nRBC 0.0 0.0 - 0.2 %    Comment: Performed at [facility], [place] [address], [place], [place] [address]  Magnesium     Status: None   Collection Time: 09/01/20  1:19 PM  Result Value Ref [place]   Magnesium 1.7 1.7 - 2.4 mg/dL    Comment: Performed at [facility], [place] [address], [place], [place] [address]  ABO/Rh     Status: None   Collection Time: 09/01/20  1:19 PM  Result Value Ref [place]   ABO/RH(D)      B POS Performed at [facility], [place] [address], [place], [place] [address]   I-Stat beta hCG blood, ED     Status: Abnormal   Collection Time: 09/01/20  1:22 PM  Result Value Ref [place]   I-stat hCG, quantitative 8.4 (H) <5 mIU/mL   Comment 3            Comment:   GEST. AGE      CONC.  (mIU/mL)   <=1 WEEK        5 - 50     2 WEEKS       50 - 500     3 WEEKS       100 - 10,000     4 WEEKS     1,000 - 30,000        FEMALE AND NON-PREGNANT FEMALE:     LESS THAN 5 mIU/mL   Urinalysis, Routine w reflex microscopic     Status: Abnormal   Collection Time: 09/01/20  2:44 PM  Result Value Ref [place]   Color, Urine AMBER (A) YELLOW    Comment: BIOCHEMICALS MAY BE AFFECTED BY COLOR   APPearance CLEAR CLEAR   Specific Gravity, Urine 1.028 1.005 - 1.030   pH 5.0 5.0 - 8.0   Glucose, UA NEGATIVE NEGATIVE mg/dL   Hgb urine dipstick SMALL (A) NEGATIVE   Bilirubin  Urine NEGATIVE NEGATIVE   Ketones, ur 5 (A) NEGATIVE mg/dL   Protein, ur 100 (A) NEGATIVE mg/dL   Nitrite NEGATIVE NEGATIVE   Leukocytes,Ua NEGATIVE NEGATIVE   RBC / HPF 0-5 0 - 5 RBC/hpf   WBC, UA 0-5 0 - 5 WBC/hpf   Bacteria, UA NONE SEEN NONE SEEN   Mucus PRESENT    Hyaline Casts, UA PRESENT     Comment: Performed at [facility], [place] [address], [place], [place] [address]  Resp Panel by RT-PCR (Flu A&B, Covid) Nasopharyngeal Swab     Status: None   Collection Time: 09/01/20  3:47 PM   Specimen: Nasopharyngeal Swab; Nasopharyngeal(NP) swabs in vial transport medium  Result Value Ref [place]   SARS Coronavirus 2 by RT PCR NEGATIVE NEGATIVE    Comment: (NOTE) SARS-CoV-2 target nucleic acids are NOT DETECTED.  The SARS-CoV-2 RNA is generally detectable in upper respiratory specimens during the acute phase of infection. The lowest concentration of SARS-CoV-2 viral copies this assay can detect is 138 copies/mL. A negative result does not preclude SARS-Cov-2 infection and should not be used as the sole basis for treatment or other patient management decisions. A negative result may occur with  improper specimen collection/handling, submission of specimen other than nasopharyngeal swab, presence of viral mutation(s) within the areas targeted by this assay, and inadequate number of viral copies(<138 copies/mL). A negative result must be combined with clinical observations, patient history, and  epidemiological information. The expected result is Negative.  Fact Sheet for Patients:  EntrepreneurPulse.com.au  Fact Sheet for Healthcare Providers:  IncredibleEmployment.be  This test is no t yet approved or cleared by the [place] FDA and  has been authorized for detection and/or diagnosis of SARS-CoV-2 by FDA under an Emergency Use Authorization (EUA). This EUA will remain  in effect (meaning this test can be used) for the  duration of the COVID-19 declaration under Section 564(b)(1) of the Act, 21 U.S.C.section 360bbb-3(b)(1), unless the authorization is terminated  or revoked sooner.       Influenza A by PCR NEGATIVE NEGATIVE   Influenza B by PCR NEGATIVE NEGATIVE    Comment: (NOTE) The Xpert Xpress SARS-CoV-2/FLU/RSV plus assay is intended as an aid in the diagnosis of influenza from Nasopharyngeal swab specimens and should not be used as a sole basis for treatment. Nasal washings and aspirates are unacceptable for Xpert Xpress SARS-CoV-2/FLU/RSV testing.  Fact Sheet for Patients: EntrepreneurPulse.com.au  Fact Sheet for Healthcare Providers: IncredibleEmployment.be  This test is not yet approved or cleared by the [place] FDA and has been authorized for detection and/or diagnosis of SARS-CoV-2 by FDA under an Emergency Use Authorization (EUA). This EUA will remain in effect (meaning this test can be used) for the duration of the COVID-19 declaration under Section 564(b)(1) of the Act, 21 U.S.C. section 360bbb-3(b)(1), unless the authorization is terminated or revoked.  Performed at [facility], [place] [address], [place], [place] [address]   Comprehensive metabolic panel     Status: Abnormal   Collection Time: 09/02/20  5:03 AM  Result Value Ref [place]   Sodium 140 135 - 145 mmol/L   Potassium 2.9 (L) 3.5 - 5.1 mmol/L    Comment: DELTA CHECK NOTED   Chloride 105 98 - 111 mmol/L   CO2 27 22 - 32 mmol/L   Glucose, Bld 90 70 - 99 mg/dL    Comment: Glucose reference range applies only to samples taken after fasting for at least 8 hours.   BUN 24 (H) 6 - 20 mg/dL   Creatinine, Ser 1.21 (H) 0.44 - 1.00 mg/dL   Calcium 8.8 (L) 8.9 - 10.3 mg/dL   Total Protein 5.9 (L) 6.5 - 8.1 g/dL   Albumin 3.4 (L) 3.5 - 5.0 g/dL   AST 13 (L) 15 - 41 U/L   ALT 11 0 - 44 U/L   Alkaline Phosphatase 29 (L) 38 - 126 U/L   Total Bilirubin 1.5 (H) 0.3 -  1.2 mg/dL   GFR, Estimated 57 (L) >60 mL/min    Comment: (NOTE) Calculated using the CKD-EPI Creatinine Equation (2021)    Anion gap 8 5 - 15    Comment: Performed at [facility], [place] [address], [place], [place] [address]  CBC     Status: Abnormal   Collection Time: 09/02/20  5:03 AM  Result Value Ref [place]   WBC 3.2 (L) 4.0 - 10.5 K/uL   RBC 3.53 (L) 3.87 - 5.11 MIL/uL   Hemoglobin 9.6 (L) 12.0 - 15.0 g/dL   HCT 29.3 (L) 36 - 46 %   MCV 83.0 80.0 - 100.0 fL   MCH 27.2 26.0 - 34.0 pg   MCHC 32.8 30.0 - 36.0 g/dL   RDW 14.7 11.5 - 15.5 %   Platelets 157 150 - 400 K/uL   nRBC 0.0 0.0 - 0.2 %    Comment: Performed at [facility], [place] [address] [place],  [place] [address]  HIV Antibody (routine testing w rflx)     Status: None   Collection Time: 09/02/20  5:03 AM  Result Value Ref [place]   HIV Screen 4th Generation wRfx Non Reactive Non Reactive    Comment: Performed at [facility], [place] [address] [place], [place] [address]  hCG, quantitative, pregnancy     Status: Abnormal   Collection Time: 09/02/20  5:03 AM  Result Value Ref [place]   hCG, Beta Chain, Quant, S 7 (H) <5 mIU/mL    Comment:          GEST. AGE      CONC.  (mIU/mL)   <=1 WEEK        5 - 50     2 WEEKS       50 - 500     3 WEEKS       100 - 10,000     4 WEEKS     1,000 - 30,000     5 WEEKS     3,500 - 115,000   6-8 WEEKS     12,000 - 270,000    12 WEEKS     15,000 - 220,000        FEMALE AND NON-PREGNANT FEMALE:     LESS THAN 5 mIU/mL Performed at [facility], [place] [address] [place], [place] [address]   Type and screen [place]     Status: None   Collection Time: 09/02/20  5:03 AM  Result Value Ref [place]   ABO/RH(D) B POS    Antibody Screen NEG    Sample Expiration      09/05/2020,2359 Performed at [facility], [place] [address], [place], [place] [address]   Ferritin     Status: None   Collection  Time: 09/02/20 11:09 AM  Result Value Ref [place]   Ferritin 69 11 - 307 ng/mL    Comment: Performed at [facility], [place] [address], [place], [place] [address]  Iron and TIBC     Status: Abnormal   Collection Time: 09/02/20 11:09 AM  Result Value Ref [place]   Iron 67 28 - 170 ug/dL   TIBC 237 (L) 250 - 450 ug/dL   Saturation Ratios 28 10.4 - 31.8 %   UIBC 170 ug/dL    Comment: Performed at [facility], [place] [address] [place], [place] [address]  Hemoglobin and hematocrit, blood     Status: Abnormal   Collection Time: 09/02/20  5:51 PM  Result Value Ref [place]   Hemoglobin 10.3 (L) 12.0 - 15.0 g/dL   HCT 32.6 (L) 36 - 46 %    Comment: Performed at [facility], [place] [address] [place], [place] [address]   DG CHEST PORT 1 VIEW  Result Date: 09/01/2020 CLINICAL DATA:  Hemoptysis for 1 day, hypertension EXAM: PORTABLE CHEST 1 VIEW COMPARISON:  03/25/2019 FINDINGS: The heart size and mediastinal contours are within normal limits. Both lungs are clear. The visualized skeletal structures are unremarkable. IMPRESSION: No active disease. Electronically Signed   By: Randa Ngo M.D.   On: 09/01/2020 16:53      Assessment/Plan 44 yo female presenting with nausea/vomiting and weight loss for several months. EGD today showed a stenotic lumen causing retained food. Patient has had ongoing weight loss and appears malnourished and dehydrated. On exam the abdomen is very soft and nontender, and there were no signs of ischemia on the EGD.  I discussed with the patient the options of fluid removal vs removing the band entirely, and she would like to have the band removed given her malnutrition and significant difficulties with PO intake over the last few months. Will discuss possible band removal with one of our bariatric surgeons (Dr. Hassell Done) in the morning.    Michaelle Birks, [facility], Hepatobiliary and Pancreatic  Surgery 09/02/20 6:16 PM

## 2020-09-02 NOTE — Progress Notes (Signed)
Patient returned from EGD AOx4. Patient answers questions appropriately. Patient has throat discomfort but denies any pain. Jerene Pitch

## 2020-09-02 NOTE — Progress Notes (Signed)
Initial Nutrition Assessment  RD working remotely.  DOCUMENTATION CODES:   Not applicable  INTERVENTION:  - diet advancement as medically feasible. - complete NFPE at follow-up.    NUTRITION DIAGNOSIS:   Inadequate oral intake related to nausea, vomiting, acute illness, inability to eat as evidenced by per patient/family report, NPO status.  GOAL:   Patient will meet greater than or equal to 90% of their needs  MONITOR:   Diet advancement, Labs, Weight trends  REASON FOR ASSESSMENT:   Malnutrition Screening Tool  ASSESSMENT:   44 y.o. female with medical history of lap band in 2010, HTN, iron deficiency anemia, and early menopause. She presented to the ED with N/V, hematemesis. In the ED she reported that on Thanksgiving she was unable to keep anything down and was vomiting continuously. She reported that N/V comes and goes and that she is fearful of eating.  Diet advanced from NPO to CLD yesterday at 20 and changed back to NPO at midnight; no intake of dinner last night documented.   Weight yesterday was 141 lb and today is 143 lb; noted high rate IV fluid. PTA, the most recently documented weight was on 04/01/20 when she weighed 161 lb. This indicates 18 lb weight loss (11% body weight) in the past 5 months; significant for time frame.   No information documented in the edema section of flow sheet. She has not been seen by a [facility] RD at any time in the past.   Per notes: - recurrent, progressive N/V x1.5 years with episode of hematemesis - significant weight loss - lap band placed in 2010 - AKI - GI following and states need for EGD with possible esophagitis and/or Mallory Weiss tear - chronic GERD   Labs reviewed; K: 2.9 mmol/l, BUN: 24 mg/dl, creatinine: 1.21 mg/dl, AST low, GFR: 57 ml/min.  Medications reviewed; 800 mg mag-ox x1 dose 11/30, 40 mg IV protonix BID, 10 mEq KCl x3 runs 11/30, 40 mEq Klor-Con x1 dose 11/30. IVF; LR-40 mEq KCl @ 100 ml/hr.     NUTRITION - FOCUSED PHYSICAL EXAM:  unable to complete at this time.   Diet Order:   Diet Order            Diet NPO time specified  Diet effective midnight                 EDUCATION NEEDS:   Not appropriate for education at this time  Skin:  Skin Assessment: Reviewed RN Assessment  Last BM:  PTA/unknown  Height:   Ht Readings from Last 1 Encounters:  09/01/20 5\' 9"  (1.753 m)    Weight:   Wt Readings from Last 1 Encounters:  09/02/20 65 kg    Estimated Nutritional Needs:  Kcal:  1820-2080 kcal Protein:  90-100 grams Fluid:  >/= 2.5 L/day      Jarome Matin, MS, RD, LDN, CNSC Inpatient Clinical Dietitian RD pager # available in [facility]  After hours/weekend pager # available in [facility]

## 2020-09-02 NOTE — Interval H&P Note (Signed)
History and Physical Interval Note:  09/02/2020 3:54 PM  Anna Zimmerman  has presented today for surgery, with the diagnosis of nausea, vomiting, hematemesis.  The various methods of treatment have been discussed with the patient and family. After consideration of risks, benefits and other options for treatment, the patient has consented to  Procedure(s): ESOPHAGOGASTRODUODENOSCOPY (EGD) (N/A) as a surgical intervention.  The patient's history has been reviewed, patient examined, no change in status, stable for surgery.  I have reviewed the patient's chart and labs.  Questions were answered to the patient's satisfaction.     [place]

## 2020-09-03 ENCOUNTER — Encounter (HOSPITAL_COMMUNITY): Payer: Self-pay | Admitting: Gastroenterology

## 2020-09-03 ENCOUNTER — Inpatient Hospital Stay (HOSPITAL_COMMUNITY): Payer: 59

## 2020-09-03 DIAGNOSIS — R112 Nausea with vomiting, unspecified: Secondary | ICD-10-CM | POA: Diagnosis not present

## 2020-09-03 DIAGNOSIS — K2990 Gastroduodenitis, unspecified, without bleeding: Secondary | ICD-10-CM

## 2020-09-03 DIAGNOSIS — Z9884 Bariatric surgery status: Secondary | ICD-10-CM | POA: Diagnosis not present

## 2020-09-03 DIAGNOSIS — E440 Moderate protein-calorie malnutrition: Secondary | ICD-10-CM | POA: Insufficient documentation

## 2020-09-03 DIAGNOSIS — K2970 Gastritis, unspecified, without bleeding: Secondary | ICD-10-CM

## 2020-09-03 DIAGNOSIS — K920 Hematemesis: Secondary | ICD-10-CM | POA: Diagnosis not present

## 2020-09-03 HISTORY — DX: Moderate protein-calorie malnutrition: E44.0

## 2020-09-03 LAB — CBC
HCT: 30.2 % — ABNORMAL LOW (ref 36.0–46.0)
Hemoglobin: 9.7 g/dL — ABNORMAL LOW (ref 12.0–15.0)
MCH: 27 pg (ref 26.0–34.0)
MCHC: 32.1 g/dL (ref 30.0–36.0)
MCV: 84.1 fL (ref 80.0–100.0)
Platelets: 139 10*3/uL — ABNORMAL LOW (ref 150–400)
RBC: 3.59 MIL/uL — ABNORMAL LOW (ref 3.87–5.11)
RDW: 14.6 % (ref 11.5–15.5)
WBC: 4.8 10*3/uL (ref 4.0–10.5)
nRBC: 0 % (ref 0.0–0.2)

## 2020-09-03 LAB — BASIC METABOLIC PANEL
Anion gap: 12 (ref 5–15)
BUN: 24 mg/dL — ABNORMAL HIGH (ref 6–20)
CO2: 21 mmol/L — ABNORMAL LOW (ref 22–32)
Calcium: 8.9 mg/dL (ref 8.9–10.3)
Chloride: 108 mmol/L (ref 98–111)
Creatinine, Ser: 1.08 mg/dL — ABNORMAL HIGH (ref 0.44–1.00)
GFR, Estimated: >60 mL/min (ref >60)
Glucose, Bld: 80 mg/dL (ref 70–99)
Potassium: 3.6 mmol/L (ref 3.5–5.1)
Sodium: 141 mmol/L (ref 135–145)

## 2020-09-03 LAB — MAGNESIUM: Magnesium: 1.4 mg/dL — ABNORMAL LOW (ref 1.7–2.4)

## 2020-09-03 MED ORDER — BOOST / RESOURCE BREEZE PO LIQD CUSTOM: 1.0000 | Freq: Two times a day (BID) | ORAL | Status: DC

## 2020-09-03 MED ORDER — ADULT MULTIVITAMIN W/MINERALS CH
1.0000 | ORAL_TABLET | Freq: Every day | ORAL | Status: DC
  Administered 2020-09-03 – 2020-09-04 (×2): 1 via ORAL
  Filled 2020-09-03 (×2): qty 1

## 2020-09-03 MED ORDER — BOOST / RESOURCE BREEZE PO LIQD CUSTOM
1.0000 | ORAL | Status: DC
  Administered 2020-09-04: 1 via ORAL

## 2020-09-03 MED ORDER — MAGNESIUM SULFATE 2 GM/50ML IV SOLN
2.0000 g | Freq: Once | INTRAVENOUS | Status: AC
  Administered 2020-09-03: 2 g via INTRAVENOUS
  Filled 2020-09-03: qty 50

## 2020-09-03 MED ORDER — ENSURE ENLIVE PO LIQD
237.0000 mL | ORAL | Status: DC
  Administered 2020-09-03: 237 mL via ORAL

## 2020-09-03 NOTE — Progress Notes (Signed)
Progress Note   Subjective  Chief Complaint: Nausea, vomiting and a small amount of hematemesis  EGD 09/02/2020 with moderate esophagitis, gastric lap band appeared tight and lumen/entrance into distal stomach was quite angulated with difficulty traversing endoscopically, gastric pouch filled with retained food debris/pills/secretions was cleared, multiple nonappearing gastric polyps, mild gastric erythema  This morning the surgical team loosen LAP-BAND.  Today, the patient tells me that she is feeling fairly well, she has been drinking some water etc. since the lap band was loosened but has not had anything to eat yet.  She is hopeful that this solves her problem.   Objective   Vital signs in last 24 hours: Temp:  [97 F (36.1 C)-98.8 F (37.1 C)] 98.4 F (36.9 C) (12/02 0624) Pulse Rate:  [62-129] 64 (12/02 0624) Resp:  [15-25] 16 (12/02 0624) BP: (103-185)/(57-123) 115/89 (12/02 0624) SpO2:  [95 %-100 %] 100 % (12/02 0624) FiO2 (%):  [21 %] 21 % (12/01 1602) Weight:  [64.9 kg-66.5 kg] 66.5 kg (12/02 0500) Last BM Date: 08/31/20 General:    AAfemale in NAD Heart:  Regular rate and rhythm; no murmurs Lungs: Respirations even and unlabored, lungs CTA bilaterally Abdomen:  Soft, nontender and nondistended. Normal bowel sounds. Extremities:  Without edema. Neurologic:  Alert and oriented,  grossly normal neurologically. Psych:  Cooperative. Normal mood and affect.  Intake/Output from previous day: 12/01 0701 - 12/02 0700 In: 2196.7 [P.O.:360; I.V.:1836.7] Out: 1150 [Urine:400; Emesis/NG output:750]  Lab Results: Recent Labs    09/01/20 1319 09/01/20 1319 09/02/20 0503 09/02/20 1751 09/03/20 0442  WBC 5.7  --  3.2*  --  4.8  HGB 12.3   < > 9.6* 10.3* 9.7*  HCT 37.8   < > 29.3* 32.6* 30.2*  PLT 252  --  157  --  139*   < > = values in this interval not displayed.   BMET Recent Labs    09/01/20 1319 09/02/20 0503 09/03/20 0442  NA 137 140 141  K 2.4* 2.9* 3.6   CL 98 105 108  CO2 29 27 21*  GLUCOSE 129* 90 80  BUN 35* 24* 24*  CREATININE 1.55* 1.21* 1.08*  CALCIUM 9.7 8.8* 8.9   LFT Recent Labs    09/02/20 0503  PROT 5.9*  ALBUMIN 3.4*  AST 13*  ALT 11  ALKPHOS 29*  BILITOT 1.5*    Studies/Results: DG CHEST PORT 1 VIEW  Result Date: 09/01/2020 CLINICAL DATA:  Hemoptysis for 1 day, hypertension EXAM: PORTABLE CHEST 1 VIEW COMPARISON:  03/25/2019 FINDINGS: The heart size and mediastinal contours are within normal limits. Both lungs are clear. The visualized skeletal structures are unremarkable. IMPRESSION: No active disease. Electronically Signed   By: Randa Ngo M.D.   On: 09/01/2020 16:53       Assessment / Plan:   Assessment: 1.  Nausea and vomiting: Status post EGD 09/02/2020 which showed a tight lap band, this has been loosened by the surgical team as of this morning, patient yet to eat anything solid 2.  Small amount of hematemesis: Esophagitis seen at time of EGD  Plan: 1.  We will see how the patient does today but hopefully loosening the LAP-BAND helps all of her symptoms. 2.  Diet as per surgical team. 3.  Upper GI pending for today 4.  Please await any final recommendations from Dr. Rush Landmark later today.  We will likely sign off.  Thank you for your kind consultation   LOS: 1 day  Lavone Nian [facility]  09/03/2020, 10:57 AM

## 2020-09-03 NOTE — Plan of Care (Signed)
  Problem: Nutrition: Goal: Adequate nutrition will be maintained Outcome: Progressing Note: Patient able to tolerate advanced diet of full liquid.    Problem: Elimination: Goal: Will not experience complications related to bowel motility Outcome: Progressing Note: Patient was able to have a bowel movement this shift.    Problem: Pain Managment: Goal: General experience of comfort will improve Outcome: Progressing Note: Patient experienced no pain during this shift.

## 2020-09-03 NOTE — Progress Notes (Signed)
Nutrition Follow-up  DOCUMENTATION CODES:   Non-severe (moderate) malnutrition in context of chronic illness  INTERVENTION:  - will order Boost Breeze once/day, each supplement provides 250 kcal and 9 grams of protein - will order Ensure Enlive once/day, each supplement provides 350 kcal and 20 grams of protein - will order 1 tablet multivitamin with minerals/day.  - continue to advance diet as tolerated.   NUTRITION DIAGNOSIS:   Moderate Malnutrition related to chronic illness (complications/side effects 2/2 lap band) as evidenced by moderate fat depletion, moderate muscle depletion, percent weight loss. -revised, ongoing  GOAL:   Patient will meet greater than or equal to 90% of their needs -unmet, unable to meet   MONITOR:   Diet advancement, PO intake, Supplement acceptance, Labs, Weight trends  REASON FOR ASSESSMENT:   Consult Poor PO  ASSESSMENT:   44 y.o. female with medical history of lap band in 2010, HTN, iron deficiency anemia, and early menopause. She presented to the ED with N/V, hematemesis. In the ED she reported that on Thanksgiving she was unable to keep anything down and was vomiting continuously. She reported that N/V comes and goes and that she is fearful of eating.  Diet advanced to CLD today at 0838 and then to [place] at 1423. RD saw patient around 1315 at which time she was still on CLD and had consumed 8 oz juice and was sipping on water throughout the day. She was very interested in diet advancement d/t feeling hungry.  Patient reports that since having lap band placed, she has struggled with reflux and was taking PPI and gas-ex nearly daily and chewing on ginger.   Symptoms began any time she tried to eat or drink. She would attempt to eat twice/day. She could feel like things were not fully moving through and felt stuck in her chest area before she would vomit. When she would have BMs, they would be loose and watery.  She reports that with intakes of  liquids today, she can feel them moving through and no longer feels the sensation of them getting stuck.   She reports that N/V episodes began about 1.5 years ago and that it has been worsening until Thanksgiving and the few days after it got so bad and she noted a large amount of blood so she presented to the ED.   Yesterday she underwent EGD with biopsies; pending results. She also had 6 ml saline/fluid removed from lap band.   Notes indicate that if patient opts to pursue lap band removal, this will be done outpatient.    Labs reviewed; BUN: 24 mg/dl, creatinine: 1.08 mg/dl, Mg: 1.4 mg/dl. Medications reviewed; 2 g IV Mg sulfate x1 run 12/2, 40 mg IV protonix BID. IVF; LR-40 mEq IV KCl @ 100 ml/hr.     NUTRITION - FOCUSED PHYSICAL EXAM:    Most Recent Value  Orbital Region Mild depletion  Upper Arm Region Moderate depletion  Thoracic and Lumbar Region Moderate depletion  Buccal Region Mild depletion  Temple Region Mild depletion  Clavicle Bone Region Severe depletion  Clavicle and Acromion Bone Region Severe depletion  Scapular Bone Region Moderate depletion  Dorsal Hand Moderate depletion  Patellar Region Moderate depletion  Anterior Thigh Region Moderate depletion  Posterior Calf Region Moderate depletion  Edema (RD Assessment) None  Hair Reviewed  Eyes Reviewed  Mouth Reviewed  Skin Reviewed  Nails Reviewed       Diet Order:   Diet Order  Diet full liquid Room service appropriate? Yes; Fluid consistency: Thin  Diet effective now                 EDUCATION NEEDS:   No education needs have been identified at this time  Skin:  Skin Assessment: Reviewed RN Assessment  Last BM:  11/29  Height:   Ht Readings from Last 1 Encounters:  09/03/20 5\' 9"  (1.753 m)    Weight:   Wt Readings from Last 1 Encounters:  09/03/20 66.5 kg     Estimated Nutritional Needs:  Kcal:  1820-2080 kcal Protein:  90-100 grams Fluid:  >/= 2.5  L/day     Jarome Matin, MS, RD, LDN, CNSC Inpatient Clinical Dietitian RD pager # available in [facility]  After hours/weekend pager # available in [facility]

## 2020-09-03 NOTE — Progress Notes (Signed)
Patient had 450 mL of emesis, which resulted after dinner around 2000. Pt did not have any nausea/vomiting when assessing patient. Nurse encouraged pt to let nurse know if feeling nauseous/vomiting so that nurse can get something for the nausea or vomiting.

## 2020-09-03 NOTE — Consult Note (Signed)
Chief Complaint:  Dysphagia and lapband placement in 2010  History of Present Illness:  Anna Zimmerman is an 44 y.o. female who had a lapband AP system in [place] in 2010 has kept off over 100 lbs.  She presented with intractable dysphagia.  She has a flat plate xray that is dx of anterior lapband slip.    Past Medical History:  Diagnosis Date  . Anemia   . Early menopause   . Hypertension     Past Surgical History:  Procedure Laterality Date  . LAPAROSCOPIC GASTRIC BANDING    . OVARIAN CYST REMOVAL    . right oopherectomy      Current Facility-Administered Medications  Medication Dose Route Frequency Provider Last Rate Last Admin  . acetaminophen (TYLENOL) tablet 650 mg  650 mg Oral Q6H PRN Elodia Florence., MD       Or  . acetaminophen (TYLENOL) suppository 650 mg  650 mg Rectal Q6H PRN Elodia Florence., MD      . hydrALAZINE (APRESOLINE) injection 10 mg  10 mg Intravenous Q4H PRN Sharion Settler, NP   10 mg at 09/01/20 2025  . lactated ringers 1,000 mL with potassium chloride 40 mEq infusion   Intravenous Continuous Patrecia Pour, MD 100 mL/hr at 09/03/20 0523 New Bag at 09/03/20 0523  . magnesium sulfate IVPB 2 g 50 mL  2 g Intravenous Once Patrecia Pour, MD      . pantoprazole (PROTONIX) injection 40 mg  40 mg Intravenous Q12H Elodia Florence., MD   40 mg at 09/02/20 2129   Azithromycin and Penicillins Family History  Problem Relation Age of Onset  . Hypertension Father   . Aneurysm Father   . Heart disease Maternal Grandmother   . Kidney disease Maternal Grandmother   . Breast cancer Other        great grandmother  . Colon cancer Neg Hx   . Stomach cancer Neg Hx   . Pancreatic cancer Neg Hx    Social History:   reports that she has never smoked. She has never used smokeless tobacco. She reports that she does not drink alcohol and does not use drugs.   REVIEW OF SYSTEMS : Negative except for see problem list  Physical Exam:   Blood pressure 115/89,  pulse 64, temperature 98.4 F (36.9 C), temperature source Oral, resp. rate 16, height 5\' 9"  (1.753 m), weight 66.5 kg, last menstrual period 12/16/2014, SpO2 100 %. Body mass index is 21.65 kg/m.  Gen:  WDWN F NAD  Neurological: Alert and oriented to person, place, and time. Motor and sensory function is grossly intact  Head: Normocephalic and atraumatic.  Eyes: Conjunctivae are normal. Pupils are equal, round, and reactive to light. No scleral icterus.  Neck: Normal range of motion. Neck supple. No tracheal deviation or thyromegaly present.  Cardiovascular:  SR without murmurs or gallops.  No carotid bruits Breast:  Not examined Respiratory: Effort normal.  No respiratory distress. No chest wall tenderness. Breath sounds normal.  No wheezes, rales or rhonchi.  Abdomen:  lapband port on the left of midline GU:  Not exmained Musculoskeletal: Normal range of motion. Extremities are nontender. No cyanosis, edema or clubbing noted Lymphadenopathy: No cervical, preauricular, postauricular or axillary adenopathy is present Skin: Skin is warm and dry. No rash noted. No diaphoresis. No erythema. No pallor. Pscyh: Normal mood and affect. Behavior is normal. Judgment and thought content normal.   LABORATORY RESULTS: Results for orders placed or performed  during the hospital encounter of 09/01/20 (from the past 48 hour(s))  Lipase, blood     Status: None   Collection Time: 09/01/20  1:19 PM  Result Value Ref Range   Lipase 34 11 - 51 U/L    Comment: Performed at [facility], [place] [address] [place], [place] [address]  Comprehensive metabolic panel     Status: Abnormal   Collection Time: 09/01/20  1:19 PM  Result Value Ref Range   Sodium 137 135 - 145 mmol/L   Potassium 2.4 (LL) 3.5 - 5.1 mmol/L    Comment: CRITICAL RESULT CALLED TO, READ BACK BY AND VERIFIED WITH: S.WEST AT 1434 ON 09/01/20 BY N.THOMPSON    Chloride 98 98 - 111 mmol/L   CO2 29 22 - 32 mmol/L   Glucose,  Bld 129 (H) 70 - 99 mg/dL    Comment: Glucose reference range applies only to samples taken after fasting for at least 8 hours.   BUN 35 (H) 6 - 20 mg/dL   Creatinine, Ser 1.55 (H) 0.44 - 1.00 mg/dL   Calcium 9.7 8.9 - 10.3 mg/dL   Total Protein 7.7 6.5 - 8.1 g/dL   Albumin 4.2 3.5 - 5.0 g/dL   AST 20 15 - 41 U/L   ALT 16 0 - 44 U/L   Alkaline Phosphatase 39 38 - 126 U/L   Total Bilirubin 1.2 0.3 - 1.2 mg/dL   GFR, Estimated 42 (L) >60 mL/min    Comment: (NOTE) Calculated using the CKD-EPI Creatinine Equation (2021)    Anion gap 10 5 - 15    Comment: Performed at [facility], [place] [address], [place], [place] [address]  CBC     Status: None   Collection Time: 09/01/20  1:19 PM  Result Value Ref Range   WBC 5.7 4.0 - 10.5 K/uL   RBC 4.62 3.87 - 5.11 MIL/uL   Hemoglobin 12.3 12.0 - 15.0 g/dL   HCT 37.8 36 - 46 %   MCV 81.8 80.0 - 100.0 fL   MCH 26.6 26.0 - 34.0 pg   MCHC 32.5 30.0 - 36.0 g/dL   RDW 14.5 11.5 - 15.5 %   Platelets 252 150 - 400 K/uL   nRBC 0.0 0.0 - 0.2 %    Comment: Performed at [facility], [place] [address] [place], [place] [address]  Magnesium     Status: None   Collection Time: 09/01/20  1:19 PM  Result Value Ref Range   Magnesium 1.7 1.7 - 2.4 mg/dL    Comment: Performed at [facility], [place] [address], [place], [place] [address]  ABO/Rh     Status: None   Collection Time: 09/01/20  1:19 PM  Result Value Ref Range   ABO/RH(D)      B POS Performed at [facility], [place] [address] [place], [place] [address]   I-Stat beta hCG blood, ED     Status: Abnormal   Collection Time: 09/01/20  1:22 PM  Result Value Ref Range   I-stat hCG, quantitative 8.4 (H) <5 mIU/mL   Comment 3            Comment:   GEST. AGE      CONC.  (mIU/mL)   <=1 WEEK        5 - 50     2 WEEKS       50 - 500     3 WEEKS  100 - 10,000     4 WEEKS     1,000 - 30,000        FEMALE AND NON-PREGNANT FEMALE:      LESS THAN 5 mIU/mL   Urinalysis, Routine w reflex microscopic     Status: Abnormal   Collection Time: 09/01/20  2:44 PM  Result Value Ref Range   Color, Urine AMBER (A) YELLOW    Comment: BIOCHEMICALS MAY BE AFFECTED BY COLOR   APPearance CLEAR CLEAR   Specific Gravity, Urine 1.028 1.005 - 1.030   pH 5.0 5.0 - 8.0   Glucose, UA NEGATIVE NEGATIVE mg/dL   Hgb urine dipstick SMALL (A) NEGATIVE   Bilirubin Urine NEGATIVE NEGATIVE   Ketones, ur 5 (A) NEGATIVE mg/dL   Protein, ur 100 (A) NEGATIVE mg/dL   Nitrite NEGATIVE NEGATIVE   Leukocytes,Ua NEGATIVE NEGATIVE   RBC / HPF 0-5 0 - 5 RBC/hpf   WBC, UA 0-5 0 - 5 WBC/hpf   Bacteria, UA NONE SEEN NONE SEEN   Mucus PRESENT    Hyaline Casts, UA PRESENT     Comment: Performed at [facility], [place] [address], [place], [place] [address]  Resp Panel by RT-PCR (Flu A&B, Covid) Nasopharyngeal Swab     Status: None   Collection Time: 09/01/20  3:47 PM   Specimen: Nasopharyngeal Swab; Nasopharyngeal(NP) swabs in vial transport medium  Result Value Ref Range   SARS Coronavirus 2 by RT PCR NEGATIVE NEGATIVE    Comment: (NOTE) SARS-CoV-2 target nucleic acids are NOT DETECTED.  The SARS-CoV-2 RNA is generally detectable in upper respiratory specimens during the acute phase of infection. The lowest concentration of SARS-CoV-2 viral copies this assay can detect is 138 copies/mL. A negative result does not preclude SARS-Cov-2 infection and should not be used as the sole basis for treatment or other patient management decisions. A negative result may occur with  improper specimen collection/handling, submission of specimen other than nasopharyngeal swab, presence of viral mutation(s) within the areas targeted by this assay, and inadequate number of viral copies(<138 copies/mL). A negative result must be combined with clinical observations, patient history, and epidemiological information. The expected result is Negative.  Fact  Sheet for Patients:  EntrepreneurPulse.com.au  Fact Sheet for Healthcare Providers:  IncredibleEmployment.be  This test is no t yet approved or cleared by the [place] FDA and  has been authorized for detection and/or diagnosis of SARS-CoV-2 by FDA under an Emergency Use Authorization (EUA). This EUA will remain  in effect (meaning this test can be used) for the duration of the COVID-19 declaration under Section 564(b)(1) of the Act, 21 U.S.C.section 360bbb-3(b)(1), unless the authorization is terminated  or revoked sooner.       Influenza A by PCR NEGATIVE NEGATIVE   Influenza B by PCR NEGATIVE NEGATIVE    Comment: (NOTE) The Xpert Xpress SARS-CoV-2/FLU/RSV plus assay is intended as an aid in the diagnosis of influenza from Nasopharyngeal swab specimens and should not be used as a sole basis for treatment. Nasal washings and aspirates are unacceptable for Xpert Xpress SARS-CoV-2/FLU/RSV testing.  Fact Sheet for Patients: EntrepreneurPulse.com.au  Fact Sheet for Healthcare Providers: IncredibleEmployment.be  This test is not yet approved or cleared by the [place] FDA and has been authorized for detection and/or diagnosis of SARS-CoV-2 by FDA under an Emergency Use Authorization (EUA). This EUA will remain in effect (meaning this test can be used) for the duration of the COVID-19 declaration under Section 564(b)(1) of the Act, 21  U.S.C. section 360bbb-3(b)(1), unless the authorization is terminated or revoked.  Performed at [facility], [place] [address], [place], [place] [address]   Comprehensive metabolic panel     Status: Abnormal   Collection Time: 09/02/20  5:03 AM  Result Value Ref Range   Sodium 140 135 - 145 mmol/L   Potassium 2.9 (L) 3.5 - 5.1 mmol/L    Comment: DELTA CHECK NOTED   Chloride 105 98 - 111 mmol/L   CO2 27 22 - 32 mmol/L   Glucose, Bld 90 70 - 99  mg/dL    Comment: Glucose reference range applies only to samples taken after fasting for at least 8 hours.   BUN 24 (H) 6 - 20 mg/dL   Creatinine, Ser 1.21 (H) 0.44 - 1.00 mg/dL   Calcium 8.8 (L) 8.9 - 10.3 mg/dL   Total Protein 5.9 (L) 6.5 - 8.1 g/dL   Albumin 3.4 (L) 3.5 - 5.0 g/dL   AST 13 (L) 15 - 41 U/L   ALT 11 0 - 44 U/L   Alkaline Phosphatase 29 (L) 38 - 126 U/L   Total Bilirubin 1.5 (H) 0.3 - 1.2 mg/dL   GFR, Estimated 57 (L) >60 mL/min    Comment: (NOTE) Calculated using the CKD-EPI Creatinine Equation (2021)    Anion gap 8 5 - 15    Comment: Performed at [facility], [place] [address] [place], [place] [address]  CBC     Status: Abnormal   Collection Time: 09/02/20  5:03 AM  Result Value Ref Range   WBC 3.2 (L) 4.0 - 10.5 K/uL   RBC 3.53 (L) 3.87 - 5.11 MIL/uL   Hemoglobin 9.6 (L) 12.0 - 15.0 g/dL   HCT 29.3 (L) 36 - 46 %   MCV 83.0 80.0 - 100.0 fL   MCH 27.2 26.0 - 34.0 pg   MCHC 32.8 30.0 - 36.0 g/dL   RDW 14.7 11.5 - 15.5 %   Platelets 157 150 - 400 K/uL   nRBC 0.0 0.0 - 0.2 %    Comment: Performed at [facility], [place] [address] [place], [place] [address]  HIV Antibody (routine testing w rflx)     Status: None   Collection Time: 09/02/20  5:03 AM  Result Value Ref Range   HIV Screen 4th Generation wRfx Non Reactive Non Reactive    Comment: Performed at [facility], [place] [address] [place], [place] [address]  hCG, quantitative, pregnancy     Status: Abnormal   Collection Time: 09/02/20  5:03 AM  Result Value Ref Range   hCG, Beta Chain, Quant, S 7 (H) <5 mIU/mL    Comment:          GEST. AGE      CONC.  (mIU/mL)   <=1 WEEK        5 - 50     2 WEEKS       50 - 500     3 WEEKS       100 - 10,000     4 WEEKS     1,000 - 30,000     5 WEEKS     3,500 - 115,000   6-8 WEEKS     12,000 - 270,000    12 WEEKS     15,000 - 220,000        FEMALE AND NON-PREGNANT FEMALE:     LESS THAN 5 mIU/mL Performed at [facility], [address] [place], [place] [address]   Type and screen [place]     Status: None   Collection Time: 09/02/20  5:03 AM  Result Value Ref Range   ABO/RH(D) B POS    Antibody Screen NEG    Sample Expiration      09/05/2020,2359 Performed at [facility], [place] [address], [place], [place] [address]   Ferritin     Status: None   Collection Time: 09/02/20 11:09 AM  Result Value Ref Range   Ferritin 69 11 - 307 ng/mL    Comment: Performed at [facility], [place] [address], [place], [place] [address]  Iron and TIBC     Status: Abnormal   Collection Time: 09/02/20 11:09 AM  Result Value Ref Range   Iron 67 28 - 170 ug/dL   TIBC 237 (L) 250 - 450 ug/dL   Saturation Ratios 28 10.4 - 31.8 %   UIBC 170 ug/dL    Comment: Performed at [facility], [place] [address] [place], [place] [address]  Hemoglobin and hematocrit, blood     Status: Abnormal   Collection Time: 09/02/20  5:51 PM  Result Value Ref Range   Hemoglobin 10.3 (L) 12.0 - 15.0 g/dL   HCT 32.6 (L) 36 - 46 %    Comment: Performed at [facility], [place] [address], [place], [place] [address]  CBC     Status: Abnormal   Collection Time: 09/03/20  4:42 AM  Result Value Ref Range   WBC 4.8 4.0 - 10.5 K/uL   RBC 3.59 (L) 3.87 - 5.11 MIL/uL   Hemoglobin 9.7 (L) 12.0 - 15.0 g/dL   HCT 30.2 (L) 36 - 46 %   MCV 84.1 80.0 - 100.0 fL   MCH 27.0 26.0 - 34.0 pg   MCHC 32.1 30.0 - 36.0 g/dL   RDW 14.6 11.5 - 15.5 %   Platelets 139 (L) 150 - 400 K/uL   nRBC 0.0 0.0 - 0.2 %    Comment: Performed at [facility], [place] [address] [place], [place] [address]  Basic metabolic panel     Status: Abnormal   Collection Time: 09/03/20  4:42 AM  Result Value Ref Range   Sodium 141 135 - 145 mmol/L   Potassium 3.6 3.5 - 5.1 mmol/L    Comment: DELTA CHECK NOTED NO VISIBLE HEMOLYSIS    Chloride 108 98 - 111 mmol/L    CO2 21 (L) 22 - 32 mmol/L   Glucose, Bld 80 70 - 99 mg/dL    Comment: Glucose reference range applies only to samples taken after fasting for at least 8 hours.   BUN 24 (H) 6 - 20 mg/dL   Creatinine, Ser 1.08 (H) 0.44 - 1.00 mg/dL   Calcium 8.9 8.9 - 10.3 mg/dL   GFR, Estimated >60 >60 mL/min    Comment: (NOTE) Calculated using the CKD-EPI Creatinine Equation (2021)    Anion gap 12 5 - 15    Comment: Performed at [facility], [place] [address], [place], [place] [address]  Magnesium     Status: Abnormal   Collection Time: 09/03/20  4:42 AM  Result Value Ref Range   Magnesium 1.4 (L) 1.7 - 2.4 mg/dL    Comment: Performed at [facility], [place] [address], [place], [place] [address]     RADIOLOGY RESULTS: DG CHEST PORT 1 VIEW  Result Date: 09/01/2020 CLINICAL DATA:  Hemoptysis  for 1 day, hypertension EXAM: PORTABLE CHEST 1 VIEW COMPARISON:  03/25/2019 FINDINGS: The heart size and mediastinal contours are within normal limits. Both lungs are clear. The visualized skeletal structures are unremarkable. IMPRESSION: No active disease. Electronically Signed   By: Randa Ngo M.D.   On: 09/01/2020 16:53    Problem List: Patient Active Problem List   Diagnosis Date Noted  . Acute renal failure ([place]) 09/02/2020  . Gastric polyps   . Gastritis and gastroduodenitis   . Hypokalemia 09/01/2020  . Recurrent vomiting 09/01/2020  . Hematemesis 09/01/2020  . 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 Belching 04/01/2020  . Epigastric abdominal pain 04/01/2020  . Weight loss, unintentional 04/01/2020  . Vitamin D deficiency 03/31/2020  . Early menopause   . Anemia 07/06/2018  . Hypertension 07/05/2018  . Amenorrhea 07/05/2018    Assessment & Plan: After I obtained informed consent, I accessed her lapband port with a Huber needle and removed 6 cc of clear fluid.  I requested that she drink and that we give her some time to see if  the anterior slip with reduce spontaneously with ingestion of liquids.  Also this will hopefully clear the esophagus of debris and make an UGI later much more accurate in assessing her esophageal motility as well as spotting an erosion.  Will follow with you.  Typically we have to submit for insurance to remove lapbands and document the indications.      Matt B. Hassell Done, MD, [facility] [phone] beeper [phone]  09/03/2020 8:13 AM

## 2020-09-03 NOTE — Progress Notes (Signed)
PROGRESS NOTE  Anna Zimmerman  GEX:[id number] DOB: 1976/01/18 DOA: 09/01/2020 PCP: [name], NP-C   Brief Narrative: Anna Zimmerman is a 44 y.o. female with a history of lap band in 2010, HTN, iron deficiency anemia, early menopause who presented 11/30 with nausea, vomiting, and a tablespoon of hematemesis. She's been taking poor per oral intake for months with plans to establish care with surgery in [place].   She was admitted, given potassium supplementation, PPI, clear liquid diet with subjective improvement. GI consulted and EGD is planned.  Assessment & Plan: Active Problems:   Hypokalemia   Recurrent vomiting   Hematemesis   Acute renal failure (HCC)   Gastric polyps   Gastritis and gastroduodenitis  N/V/hematemesis, GERD: Most likely due to lap band (placed 2010) obstruction seen in addition to moderate esophagitis on EGD 12/1.  - Follow up gastric biopsies (r/o H. pylori) from EGD 12/1 - Continue PPI.   - Diet per GI/surgery > clears and advance per their recommendations - Surgery consulted, took 6cc fluid out of lap band 12/2. Follow up with surgery for removal as outpatient is current plan.  Hypokalemia: - Continued supplementation this AM, plan to stop IVF w/K once tolerating diet. K 3.6 this AM.   Hypomagnesemia:  - Supplement this AM, recheck w/metabolic panel tmrw AM.  Iron deficiency anemia, unintentional weight loss:  - Dietitian consulted.  - Iron studies show stable stores. Hgb stable.   Iron deficiency anemia:  - Continue monitoring CBC. With no further bleeding, decline in hgb suspected to be hemodilution in setting of significant dehydration and known anemia.  - Iron studies pending, may need IV iron. - H/H this PM to confirm stability  AKI: Continues to improve. - Will stop IVF once taking a diet.   HTN:  - Holding lisinopril and HCTZ in light of dehydration and hypokalemia. Normotensive.  History of early menopause: LMP 2016. No  recent VB.  - Follow up with Gyn as outpatient. D/w Dr. [name] the mildly elevated hCG. This represents pituitary secretion, will follow x1 on 12/3 to confirm.  DVT prophylaxis: SCDs Code Status: Full Family Communication: None at bedside Disposition Plan:  Status is: Inpatient  Remains inpatient appropriate because:Ongoing diagnostic testing needed not appropriate for outpatient work up  Dispo: The patient is from: Home              Anticipated d/c is to: Home              Anticipated d/c date is: 1 day if tolerates diet, upper GI series shows patency, and cleared by surgery and GI for discharge.              Patient currently is not medically stable to d/c.   Consultants:   GI  General surgery  Procedures:   EGD 09/02/2020 Dr. Havery Moros Impression:       - Esophagogastric landmarks identified.                           - Moderate esophagitis.                           - Normal esophagus otherwise                           - Gastric lap band appears tight and lumen /  entrance into distal stomach is quite angulated                            with difficulty traversing it endoscopically.                            Gastric pouch is was filled with retained food                            debris / pills / secretions which was cleared.                           - Multiple benign appearing gastric polyps.                            Representative sample resected and retrieved.                           - Mild gastric erythema - biopsies taken to rule                            out H pylori                           - Normal duodenum                           Gastric lap band appears to be the cause of the                            patient's symptoms. Unclear if this has migrated or                            herniated causing endoscopic findings. She would                            benefit from lap band removal or deflation given                             persistent longstanding symptoms / weight loss,                            inability to tolerate much PO.  Recommendation: - Return patient to hospital ward for ongoing care.                           - Clear liquid diet (patient states she has been                            able to tolerated clear liquids since admission).                           - Continue present medications.                           -  Continue antiemetics as needed                           - Await pathology results.                           - General surgery consult                           - Will obtain upper GI series to further                            characterize lap band anatomy  Antimicrobials:  None   Subjective: No bleeding, no vomiting, she is nauseated but without abdominal pain. Hasn't had food yet this AM. Had fluid taken out of lap band port this AM.  Objective: Vitals:   09/02/20 1705 09/02/20 2229 09/03/20 0500 09/03/20 0624  BP: 115/76 121/88  115/89  Pulse:  62  64  Resp: 16 16  16   Temp:  98.7 F (37.1 C)  98.4 F (36.9 C)  TempSrc:  Oral  Oral  SpO2: 100% 100%  100%  Weight:   66.5 kg   Height:   5\' 9"  (1.753 m)     Intake/Output Summary (Last 24 hours) at 09/03/2020 1135 Last data filed at 09/03/2020 0600 Gross per 24 hour  Intake 2196.72 ml  Output 1150 ml  Net 1046.72 ml   Filed Weights   09/02/20 0500 09/02/20 1453 09/03/20 0500  Weight: 65 kg 64.9 kg 66.5 kg   Gen: 44 y.o. female in no distress Pulm: Nonlabored breathing room air. Clear. CV: Regular rate and rhythm. No murmur, rub, or gallop. No JVD, no dependent edema. GI: Abdomen soft, non-tender, non-distended, palpable lap band port is nontender without discharge overlying, with normoactive bowel sounds.  Ext: Warm, no deformities Skin: No new rashes, lesions or ulcers on visualized skin. Neuro: Alert and oriented. No focal neurological deficits. Psych: Judgement and insight appear fair. Mood euthymic &  affect congruent. Behavior is appropriate.    Data Reviewed: I have personally reviewed following labs and imaging studies  CBC: Recent Labs  Lab 09/01/20 1319 09/02/20 0503 09/02/20 1751 09/03/20 0442  WBC 5.7 3.2*  --  4.8  HGB 12.3 9.6* 10.3* 9.7*  HCT 37.8 29.3* 32.6* 30.2*  MCV 81.8 83.0  --  84.1  PLT 252 157  --  970*   Basic Metabolic Panel: Recent Labs  Lab 09/01/20 1319 09/02/20 0503 09/03/20 0442  NA 137 140 141  K 2.4* 2.9* 3.6  CL 98 105 108  CO2 29 27 21*  GLUCOSE 129* 90 80  BUN 35* 24* 24*  CREATININE 1.55* 1.21* 1.08*  CALCIUM 9.7 8.8* 8.9  MG 1.7  --  1.4*   GFR: Estimated Creatinine Clearance: 70.2 mL/min (A) (by C-G formula based on SCr of 1.08 mg/dL (H)). Liver Function Tests: Recent Labs  Lab 09/01/20 1319 09/02/20 0503  AST 20 13*  ALT 16 11  ALKPHOS 39 29*  BILITOT 1.2 1.5*  PROT 7.7 5.9*  ALBUMIN 4.2 3.4*   Recent Labs  Lab 09/01/20 1319  LIPASE 34   No results for input(s): AMMONIA in the last 168 hours. Coagulation Profile: No results for input(s): INR, PROTIME in the last 168 hours. Cardiac Enzymes: No results for input(s): CKTOTAL, CKMB, CKMBINDEX, TROPONINI in  the last 168 hours. BNP (last 3 results) No results for input(s): PROBNP in the last 8760 hours. HbA1C: No results for input(s): HGBA1C in the last 72 hours. CBG: No results for input(s): GLUCAP in the last 168 hours. Lipid Profile: No results for input(s): CHOL, HDL, LDLCALC, TRIG, CHOLHDL, LDLDIRECT in the last 72 hours. Thyroid Function Tests: No results for input(s): TSH, T4TOTAL, FREET4, T3FREE, THYROIDAB in the last 72 hours. Anemia Panel: Recent Labs    09/02/20 1109  FERRITIN 69  TIBC 237*  IRON 67   Urine analysis:    Component Value Date/Time   COLORURINE AMBER (A) 09/01/2020 1444   APPEARANCEUR CLEAR 09/01/2020 1444   LABSPEC 1.028 09/01/2020 1444   LABSPEC 1.015 07/05/2018 1404   PHURINE 5.0 09/01/2020 1444   GLUCOSEU NEGATIVE 09/01/2020  1444   HGBUR SMALL (A) 09/01/2020 1444   BILIRUBINUR NEGATIVE 09/01/2020 1444   BILIRUBINUR negative 07/05/2018 1404   KETONESUR 5 (A) 09/01/2020 1444   PROTEINUR 100 (A) 09/01/2020 1444   UROBILINOGEN 1.0 05/07/2015 2010   NITRITE NEGATIVE 09/01/2020 1444   LEUKOCYTESUR NEGATIVE 09/01/2020 1444   Recent Results (from the past 240 hour(s))  Resp Panel by RT-PCR (Flu A&B, Covid) Nasopharyngeal Swab     Status: None   Collection Time: 09/01/20  3:47 PM   Specimen: Nasopharyngeal Swab; Nasopharyngeal(NP) swabs in vial transport medium  Result Value Ref Range Status   SARS Coronavirus 2 by RT PCR NEGATIVE NEGATIVE Final    Comment: (NOTE) SARS-CoV-2 target nucleic acids are NOT DETECTED.  The SARS-CoV-2 RNA is generally detectable in upper respiratory specimens during the acute phase of infection. The lowest concentration of SARS-CoV-2 viral copies this assay can detect is 138 copies/mL. A negative result does not preclude SARS-Cov-2 infection and should not be used as the sole basis for treatment or other patient management decisions. A negative result may occur with  improper specimen collection/handling, submission of specimen other than nasopharyngeal swab, presence of viral mutation(s) within the areas targeted by this assay, and inadequate number of viral copies(<138 copies/mL). A negative result must be combined with clinical observations, patient history, and epidemiological information. The expected result is Negative.  Fact Sheet for Patients:  EntrepreneurPulse.com.au  Fact Sheet for Healthcare Providers:  IncredibleEmployment.be  This test is no t yet approved or cleared by the [place] FDA and  has been authorized for detection and/or diagnosis of SARS-CoV-2 by FDA under an Emergency Use Authorization (EUA). This EUA will remain  in effect (meaning this test can be used) for the duration of the COVID-19 declaration under  Section 564(b)(1) of the Act, 21 U.S.C.section 360bbb-3(b)(1), unless the authorization is terminated  or revoked sooner.       Influenza A by PCR NEGATIVE NEGATIVE Final   Influenza B by PCR NEGATIVE NEGATIVE Final    Comment: (NOTE) The Xpert Xpress SARS-CoV-2/FLU/RSV plus assay is intended as an aid in the diagnosis of influenza from Nasopharyngeal swab specimens and should not be used as a sole basis for treatment. Nasal washings and aspirates are unacceptable for Xpert Xpress SARS-CoV-2/FLU/RSV testing.  Fact Sheet for Patients: EntrepreneurPulse.com.au  Fact Sheet for Healthcare Providers: IncredibleEmployment.be  This test is not yet approved or cleared by the [place] FDA and has been authorized for detection and/or diagnosis of SARS-CoV-2 by FDA under an Emergency Use Authorization (EUA). This EUA will remain in effect (meaning this test can be used) for the duration of the COVID-19 declaration under Section 564(b)(1) of the Act,  21 U.S.C. section 360bbb-3(b)(1), unless the authorization is terminated or revoked.  Performed at [facility], [place] [address], [place], [place] [address]       Radiology Studies: DG CHEST PORT 1 VIEW  Result Date: 09/01/2020 CLINICAL DATA:  Hemoptysis for 1 day, hypertension EXAM: PORTABLE CHEST 1 VIEW COMPARISON:  03/25/2019 FINDINGS: The heart size and mediastinal contours are within normal limits. Both lungs are clear. The visualized skeletal structures are unremarkable. IMPRESSION: No active disease. Electronically Signed   By: Randa Ngo M.D.   On: 09/01/2020 16:53    Scheduled Meds: . pantoprazole (PROTONIX) IV  40 mg Intravenous Q12H   Continuous Infusions: . lactated ringers with kcl 100 mL/hr at 09/03/20 0523  . magnesium sulfate bolus IVPB 2 g (09/03/20 1102)     LOS: 1 day   Time spent: 35 minutes.  Patrecia Pour, MD [facility] [email or web address]  09/03/2020, 11:35 AM

## 2020-09-03 NOTE — Plan of Care (Signed)
  Problem: Education: Goal: Knowledge of General Education information will improve Description: Including pain rating scale, medication(s)/side effects and non-pharmacologic comfort measures Outcome: Progressing   Problem: Clinical Measurements: Goal: Respiratory complications will improve Outcome: Progressing Goal: Cardiovascular complication will be avoided Outcome: Progressing   Problem: Activity: Goal: Risk for activity intolerance will decrease Outcome: Progressing   Problem: Coping: Goal: Level of anxiety will decrease Outcome: Progressing   Problem: Elimination: Goal: Will not experience complications related to bowel motility Outcome: Progressing Goal: Will not experience complications related to urinary retention Outcome: Progressing   Problem: Pain Managment: Goal: General experience of comfort will improve Outcome: Progressing   Problem: Safety: Goal: Ability to remain free from injury will improve Outcome: Progressing   Problem: Skin Integrity: Goal: Risk for impaired skin integrity will decrease Outcome: Progressing

## 2020-09-04 ENCOUNTER — Encounter: Payer: Self-pay | Admitting: Gastroenterology

## 2020-09-04 ENCOUNTER — Inpatient Hospital Stay (HOSPITAL_COMMUNITY): Payer: 59

## 2020-09-04 ENCOUNTER — Other Ambulatory Visit: Payer: Self-pay

## 2020-09-04 DIAGNOSIS — R933 Abnormal findings on diagnostic imaging of other parts of digestive tract: Secondary | ICD-10-CM

## 2020-09-04 LAB — MAGNESIUM: Magnesium: 1.8 mg/dL (ref 1.7–2.4)

## 2020-09-04 LAB — BASIC METABOLIC PANEL
Anion gap: 7 (ref 5–15)
BUN: 15 mg/dL (ref 6–20)
CO2: 26 mmol/L (ref 22–32)
Calcium: 8.5 mg/dL — ABNORMAL LOW (ref 8.9–10.3)
Chloride: 103 mmol/L (ref 98–111)
Creatinine, Ser: 1.07 mg/dL — ABNORMAL HIGH (ref 0.44–1.00)
GFR, Estimated: >60 mL/min (ref >60)
Glucose, Bld: 93 mg/dL (ref 70–99)
Potassium: 3.7 mmol/L (ref 3.5–5.1)
Sodium: 136 mmol/L (ref 135–145)

## 2020-09-04 LAB — SURGICAL PATHOLOGY

## 2020-09-04 LAB — HCG, SERUM, QUALITATIVE: Preg, Serum: NEGATIVE

## 2020-09-04 MED ORDER — PANTOPRAZOLE SODIUM 40 MG PO TBEC: 40.0000 mg | DELAYED_RELEASE_TABLET | Freq: Two times a day (BID) | ORAL | Status: DC

## 2020-09-04 MED ORDER — HYDROCHLOROTHIAZIDE 25 MG PO TABS: 25.0000 mg | ORAL_TABLET | Freq: Every day | ORAL | 4 refills | Status: DC

## 2020-09-04 MED ORDER — OMEPRAZOLE 20 MG PO CPDR: 20.0000 mg | DELAYED_RELEASE_CAPSULE | Freq: Every day | ORAL | 0 refills | Status: DC

## 2020-09-04 NOTE — Progress Notes (Signed)
    Progress Note   Subjective  Chief Complaint: Nausea, vomiting a small amount of hematemesis  Status post EGD 09/02/2020 with moderate esophagitis in the gastric lap band which appeared tight, this was loosened yesterday 09/03/2020 by the surgical team  Patient is feeling better today, tolerating clear liquids which she was unable to tolerate before but wonders how she will feel after real food.  She does have an upper GI study ordered which has not been done yet.   Objective   Vital signs in last 24 hours: Temp:  [98 F (36.7 C)-98.6 F (37 C)] 98 F (36.7 C) (12/03 0503) Pulse Rate:  [61-74] 61 (12/03 0503) Resp:  [17-18] 18 (12/03 0503) BP: (106-120)/(78-89) 110/78 (12/03 0503) SpO2:  [100 %] 100 % (12/03 0503) Weight:  [68.5 kg] 68.5 kg (12/03 0500) Last BM Date: 09/03/20 General:  AA female in NAD Heart:  Regular rate and rhythm; no murmurs Lungs: Respirations even and unlabored, lungs CTA bilaterally Abdomen:  Soft, nontender and nondistended. Normal bowel sounds. Extremities:  Without edema. Psych:  Cooperative. Normal mood and affect.  Intake/Output from previous day: 12/02 0701 - 12/03 0700 In: 240 [P.O.:240] Out: 400 [Urine:400]  Lab Results: Recent Labs    09/01/20 1319 09/01/20 1319 09/02/20 0503 09/02/20 1751 09/03/20 0442  WBC 5.7  --  3.2*  --  4.8  HGB 12.3   < > 9.6* 10.3* 9.7*  HCT 37.8   < > 29.3* 32.6* 30.2*  PLT 252  --  157  --  139*   < > = values in this interval not displayed.   BMET Recent Labs    09/02/20 0503 09/03/20 0442 09/04/20 0417  NA 140 141 136  K 2.9* 3.6 3.7  CL 105 108 103  CO2 27 21* 26  GLUCOSE 90 80 93  BUN 24* 24* 15  CREATININE 1.21* 1.08* 1.07*  CALCIUM 8.8* 8.9 8.5*   LFT Recent Labs    09/02/20 0503  PROT 5.9*  ALBUMIN 3.4*  AST 13*  ALT 11  ALKPHOS 29*  BILITOT 1.5*    Assessment / Plan:   Assessment: 1.  Nausea and vomiting: Status post EGD 09/02/2020 with a tight lap band, loosened by the  surgical team yesterday, patient feeling better 2.  Small amount of hematemesis  Plan: 1.  Patient is feeling better after her lap band was loosened yesterday, likely this is the cause of her symptoms. 2.  Upper GI is scheduled for early this morning, we have not yet received results she was going to this procedure when we left her room. 3.  After upper GI we will likely sign off.  Please await any final recommendations from Dr. Rush Landmark later today and patient can likely go home, she would like to try a regular diet before leaving.   Thank you for your kind consultation.   LOS: 2 days   Levin Erp  09/04/2020, 11:16 AM

## 2020-09-04 NOTE — Progress Notes (Signed)
Patient discharged to home. No DME needed. Reviewed patients discharge medications and follow up appointments with her, she verbalized understanding and denies any questions. IV removed from right wrist, tele removed. Patient did eat her dinner with no other c/o nausea. Denies any other questions or concerns. Discharged to private vehicle via wheelchair with her personal belongings.

## 2020-09-04 NOTE — Discharge Instructions (Signed)
Eating Plan After Bariatric Surgery, Stage 1 A diet after weight loss surgery (bariatric surgery) should provide plenty of fluids and nutrients while promoting weight loss and healing. Each stage of recovery has a different set of food and drink recommendations. Your health care provider may recommend that you work with a diet and nutrition specialist (dietitian) to make a staged eating plan that is right for you. Stage 1 begins right after surgery and lasts until about 2 weeks after surgery, or as long as directed by your health care provider. During this time, you will eat a liquid-only diet. You will be on clear liquids immediately after surgery. After your health care provider approves, you will move on to full liquids. What are tips for following this plan?  Meal planning  Eat at set times. Allow 30-45 minutes for each meal.  Have protein with every meal. Eat protein foods first. Protein is a very important nutrient after surgery.  You will need at least 60-80 g of protein daily, or as much as determined by your dietitian. Work with your dietitian to choose a liquid protein supplement that has: ? At least 15 g of protein per 8 oz serving. ? Less than 20 g total carbohydrate per 8 oz serving. ? Less than 5 g fat per 8 oz serving.  To get more protein, you may add 1 Tbsp non-fat dry milk powder to each  cup of skim milk.  Do not eat or drink: ? Carbonated beverages. ? Sweets with more than 25 g of sugar per serving. ? High-fat foods. This includes foods with more than 5 g of fat per serving. ? Alcohol. ? Any foods you do not tolerate well, such as dairy or high-fiber foods.  Move on to a [place] when your health care provider approves. For most people, this happens after 2 weeks on a liquid-only diet. General instructions  Sip liquids. Do not use a straw until several weeks after surgery.  Do not drink extra liquids with meals for 30 minutes before or after meals.  Slowly  sip 8-10 oz of clear liquid, preferably water, between each meal. Try to get at least 48-60 oz of fluid each day.  Take a liquid or chewable multivitamin with iron each day. Discuss additional supplements with your health care provider or dietitian.  Stop eating when you feel full.  Your surgeon or dietitian may have specific eating guidelines for you. This may depend on: ? The type of weight loss surgery you had. ? Your overall health. Clear liquids It is important to drink clear liquids to make sure you stay hydrated. Try to drink at least 24-30 oz (about 3 cups) of clear liquids each day. Clear liquids have:  No carbonation.  No sugar or calories.  No caffeine or alcohol. Clear liquids include:  Water and flavored water.  Decaffeinated coffee or tea.  "Light" powdered juice mixes, like lemonade.  Clear broth.  "Light" flavored gelatin.  Sugar-free popsicles. Full liquids Full liquids are important for making sure you have enough calories, protein, and other nutrients. Try to get at least 24-30 oz (about 3 cups) of full liquids each day. Full liquids include:  Low-fat or fat-free milk.  Soy milk.  Yogurt.  Protein shakes or protein powder.  "Light" meal replacement shakes.  Sugar-free pudding. Summary  Stage 1 begins right after surgery and lasts until about 2 weeks after surgery, or as directed by your health care provider.  Stage 1 diet is a liquid-only  diet. You will be on clear liquids immediately after surgery. After your health care provider approves, you will move to full liquids.  Eating enough protein and drinking plenty of fluids are important in promoting weight loss and healing after surgery. This information is not intended to replace advice given to you by your health care provider. Make sure you discuss any questions you have with your health care provider. Document Revised: 01/10/2019 Document Reviewed: 12/19/2016 Elsevier Patient Education   [place].

## 2020-09-04 NOTE — Discharge Summary (Signed)
Physician Discharge Summary  Anna Zimmerman AQT:[id number] DOB: March 25, 1976 DOA: 09/01/2020  PCP: [name], NP-C  Admit date: 09/01/2020 Discharge date: 09/04/2020  Admitted From: home Disposition:  home  Recommendations for Outpatient Follow-up:  1. Follow up with PCP in 1-2 weeks 2. Please obtain BMP/CBC in one week 3. Please follow up on the following pending results:  Home Health:no  Equipment/Devices:none  Discharge Condition: Stable Code Status:   Code Status: Full Code Diet recommendation:  Diet Order            Diet - low sodium heart healthy           Diet full liquid Room service appropriate? Yes; Fluid consistency: Thin  Diet effective now                 Brief/Interim Summary: 44 y.o. female with a history of lap band in 2010, HTN, iron deficiency anemia, early menopause who presented 11/30 with nausea, vomiting, and a tablespoon of hematemesis. She's been taking poor per oral intake for months with plans to establish care with surgery in [place].  She was admitted, given potassium supplementation, PPI, clear liquid diet with subjective improvement. GI was consulted and EGD is planned. Status post EGD 12/1 with a tight lap band.  Patient was seen by surgery fluid was removed and band was loosened.  Feel that nausea vomiting likely secondary to slippery lab. Patient was placed back on full liquid diet and tolerated well.  She underwent upper GI series by surgery 12/3 that showed combination of findings including lap band placement on AP scout radiograph) of the proximal parts which are suspicious for LAP-BAND slippage no evidence of obstruction. I discussed with the surgery team [name], Dr [name] has reviewed the films and planning for her feels that patient has anterior slip but no erosion and can go home on full liquids and will follow up in office for possible elective lap band removal  Discharge Diagnoses:   Nausea vomiting/possible  hematemesis/GERD-possibly due to slippage of lap band .Now resolved.  Plan is to discharge on full liquid diet and outpatient follow-up for elective lap band removal   Hypokalemia/-resolved   Recurrent vomiting-resolved   Acute renal failure resolved Iron deficiency anemia hemoglobin stable.  Iron panel stable. Hypertension blood pressure stable HCTZ and lisinopril held due to dehydration and hypokalemia.  Resume on outpatient basis after follow-up with PCPHistory of early menopause: LMP 2016. No recent VB.  Follow up with Gyn as outpatient. D/w Dr. [name] the mildly elevated hCG. This represents pituitary secretion, followed up next day 12/3 and was negative.   Consults:  GI and surgery  Subjective: Alert awake, ambulating.  Discharge Exam: Vitals:   09/04/20 0503 09/04/20 1227  BP: 110/78 123/87  Pulse: 61 74  Resp: 18 18  Temp: 98 F (36.7 C) 97.6 F (36.4 C)  SpO2: 100% 100%   General: Pt is alert, awake, not in acute distress Cardiovascular: RRR, S1/S2 +, no rubs, no gallops Respiratory: CTA bilaterally, no wheezing, no rhonchi Abdominal: Soft, NT, ND, bowel sounds + Extremities: no edema, no cyanosis  Discharge Instructions  Discharge Instructions    Diet - low sodium heart healthy   Complete by: As directed    Discharge instructions   Complete by: As directed    Start  taking blood pressure meds hctz if your blood pressure starts to go above 140s .  If blood pressure remains uncontrolled despite being on HCTZ then you can start taking your lisinopril  as well.  Please follow-up with your primary care doctor for further recommendation and monitor blood pressure every day at home.  You will need to follow-up with your surgery to discuss about your lap band as scheduled in next visit.   Please call call MD or return to ER for similar or worsening recurring problem that brought you to hospital or if any fever,nausea/vomiting,abdominal pain, uncontrolled pain, chest pain,   shortness of breath or any other alarming symptoms.  Please follow-up your doctor as instructed in a week time and call the office for appointment.  Please avoid alcohol, smoking, or any other illicit substance and maintain healthy habits including taking your regular medications as prescribed.  You were cared for by a hospitalist during your hospital stay. If you have any questions about your discharge medications or the care you received while you were in the hospital after you are discharged, you can call the unit and ask to speak with the hospitalist on call if the hospitalist that took care of you is not available.  Once you are discharged, your primary care physician will handle any further medical issues. Please note that NO REFILLS for any discharge medications will be authorized once you are discharged, as it is imperative that you return to your primary care physician (or establish a relationship with a primary care physician if you do not have one) for your aftercare needs so that they can reassess your need for medications and monitor your lab values   Increase activity slowly   Complete by: As directed      Allergies as of 09/04/2020      Reactions   Azithromycin    rash   Penicillins    Rash Has patient had a PCN reaction causing immediate rash, facial/tongue/throat swelling, SOB or lightheadedness with hypotension: YES Has patient had a PCN reaction causing severe rash involving mucus membranes or skin necrosis:NO Has patient had a PCN reaction that required hospitalizationNO Has patient had a PCN reaction occurring within the last 10 years: NO If all of the above answers are "NO", then may proceed with Cephalosporin use.      Medication List    STOP taking these medications   lisinopril 40 MG tablet Commonly known as: ZESTRIL   Vitamin D (Ergocalciferol) 1.25 MG (50000 UNIT) Caps capsule Commonly known as: DRISDOL     TAKE these medications   acetaminophen 500 MG  tablet Commonly known as: TYLENOL Take 1,000 mg by mouth every 8 (eight) hours as needed for mild pain or headache.   docusate sodium 100 MG capsule Commonly known as: Colace Take 1 capsule (100 mg total) by mouth 2 (two) times daily. What changed: when to take this   ferrous gluconate 324 MG tablet Commonly known as: FERGON TAKE 1 TABLET(324 MG) BY MOUTH THREE TIMES DAILY WITH MEALS What changed: See the new instructions.   hydrochlorothiazide 25 MG tablet Commonly known as: HYDRODIURIL Take 1 tablet (25 mg total) by mouth daily. Start  taking if your blood pressure starts to go above 140s Start taking on: September 08, 2020 What changed:   See the new instructions.  These instructions start on September 08, 2020. If you are unsure what to do until then, ask your doctor or other care provider.   omeprazole 20 MG capsule Commonly known as: PRILOSEC Take 1 capsule (20 mg total) by mouth daily. What changed: See the new instructions.       Follow-up Information    Hassell Done,  Rodman Key, MD. Daphane Shepherd on 10/16/2020.   Specialty: General Surgery Why: Appointment scheduled for 2:30 PM. Please arrive 30 min prior to appointment time and bring photo ID and insurance information.  Contact information: [address] [place] [address] [phone]        Harland Dingwall L, NP-C Follow up in 1 week(s).   Specialty: Family Medicine Contact information: [address] [place] [address] [phone]              Allergies  Allergen Reactions  . Azithromycin     rash  . Penicillins     Rash Has patient had a PCN reaction causing immediate rash, facial/tongue/throat swelling, SOB or lightheadedness with hypotension: YES Has patient had a PCN reaction causing severe rash involving mucus membranes or skin necrosis:NO Has patient had a PCN reaction that required hospitalizationNO Has patient had a PCN reaction occurring within the last 10 years: NO If all of the  above answers are "NO", then may proceed with Cephalosporin use.    The results of significant diagnostics from this hospitalization (including imaging, microbiology, ancillary and laboratory) are listed below for reference.    Microbiology: Recent Results (from the past 240 hour(s))  Resp Panel by RT-PCR (Flu A&B, Covid) Nasopharyngeal Swab     Status: None   Collection Time: 09/01/20  3:47 PM   Specimen: Nasopharyngeal Swab; Nasopharyngeal(NP) swabs in vial transport medium  Result Value Ref Range Status   SARS Coronavirus 2 by RT PCR NEGATIVE NEGATIVE Final    Comment: (NOTE) SARS-CoV-2 target nucleic acids are NOT DETECTED.  The SARS-CoV-2 RNA is generally detectable in upper respiratory specimens during the acute phase of infection. The lowest concentration of SARS-CoV-2 viral copies this assay can detect is 138 copies/mL. A negative result does not preclude SARS-Cov-2 infection and should not be used as the sole basis for treatment or other patient management decisions. A negative result may occur with  improper specimen collection/handling, submission of specimen other than nasopharyngeal swab, presence of viral mutation(s) within the areas targeted by this assay, and inadequate number of viral copies(<138 copies/mL). A negative result must be combined with clinical observations, patient history, and epidemiological information. The expected result is Negative.  Fact Sheet for Patients:  EntrepreneurPulse.com.au  Fact Sheet for Healthcare Providers:  IncredibleEmployment.be  This test is no t yet approved or cleared by the [place] FDA and  has been authorized for detection and/or diagnosis of SARS-CoV-2 by FDA under an Emergency Use Authorization (EUA). This EUA will remain  in effect (meaning this test can be used) for the duration of the COVID-19 declaration under Section 564(b)(1) of the Act, 21 U.S.C.section 360bbb-3(b)(1),  unless the authorization is terminated  or revoked sooner.       Influenza A by PCR NEGATIVE NEGATIVE Final   Influenza B by PCR NEGATIVE NEGATIVE Final    Comment: (NOTE) The Xpert Xpress SARS-CoV-2/FLU/RSV plus assay is intended as an aid in the diagnosis of influenza from Nasopharyngeal swab specimens and should not be used as a sole basis for treatment. Nasal washings and aspirates are unacceptable for Xpert Xpress SARS-CoV-2/FLU/RSV testing.  Fact Sheet for Patients: EntrepreneurPulse.com.au  Fact Sheet for Healthcare Providers: IncredibleEmployment.be  This test is not yet approved or cleared by the [place] FDA and has been authorized for detection and/or diagnosis of SARS-CoV-2 by FDA under an Emergency Use Authorization (EUA). This EUA will remain in effect (meaning this test can be used) for the duration of  the COVID-19 declaration under Section 564(b)(1) of the Act, 21 U.S.C. section 360bbb-3(b)(1), unless the authorization is terminated or revoked.  Performed at [facility], [place] [address], [place], [place] [address]     Procedures/Studies: DG CHEST PORT 1 VIEW  Result Date: 09/01/2020 CLINICAL DATA:  Hemoptysis for 1 day, hypertension EXAM: PORTABLE CHEST 1 VIEW COMPARISON:  03/25/2019 FINDINGS: The heart size and mediastinal contours are within normal limits. Both lungs are clear. The visualized skeletal structures are unremarkable. IMPRESSION: No active disease. Electronically Signed   By: Randa Ngo M.D.   On: 09/01/2020 16:53   DG UGI W SINGLE CM (SOL OR THIN BA)  Result Date: 09/04/2020 CLINICAL DATA:  Nausea and vomiting.  Recent lap band adjustment. EXAM: UPPER GI SERIES WITH KUB TECHNIQUE: After obtaining a scout radiograph a routine upper GI series was performed using thin barium FLUOROSCOPY TIME:  Fluoroscopy Time:  3 minutes and 0 seconds Radiation Exposure Index (if provided by the  fluoroscopic device): 15.7 mGy Number of Acquired Spot Images: 0 COMPARISON:  Multiple chest radiographs including 09/01/2020 FINDINGS: Preprocedure scout film demonstrates the lap band to obliquely positioned relative to expected ( "0 sign" ). This has been described with lap band slippage, and has been present back to 2016. With individual swallows in RAO position, there is esophageal dysmotility with contrast stasis throughout the esophagus. With subsequent swallows, normal appearance of the esophagus, without persistent narrowing to suggest stricture. No evidence of obstruction at the lap band site. The proximal pouch is somewhat prominent, including on image 33 of series 5. IMPRESSION: Combination of findings, including lap band position on AP scout radiograph and prominence of the proximal pouch which are suspicious for lap band slippage. No evidence of obstruction at the lap band site. 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 These results will be called to the ordering clinician or representative by the Radiologist Assistant, and communication documented in the PACS or [employer]. Electronically Signed   By: Abigail Miyamoto M.D.   On: 09/04/2020 12:43    Labs: BNP (last 3 results) No results for input(s): BNP in the last 8760 hours. Basic Metabolic Panel: Recent Labs  Lab 09/01/20 1319 09/02/20 0503 09/03/20 0442 09/04/20 0417  NA 137 140 141 136  K 2.4* 2.9* 3.6 3.7  CL 98 105 108 103  CO2 29 27 21* 26  GLUCOSE 129* 90 80 93  BUN 35* 24* 24* 15  CREATININE 1.55* 1.21* 1.08* 1.07*  CALCIUM 9.7 8.8* 8.9 8.5*  MG 1.7  --  1.4* 1.8   Liver Function Tests: Recent Labs  Lab 09/01/20 1319 09/02/20 0503  AST 20 13*  ALT 16 11  ALKPHOS 39 29*  BILITOT 1.2 1.5*  PROT 7.7 5.9*  ALBUMIN 4.2 3.4*   Recent Labs  Lab 09/01/20 1319  LIPASE 34   No results for input(s): AMMONIA in the last 168 hours. CBC: Recent Labs  Lab 09/01/20 1319 09/02/20 0503  09/02/20 1751 09/03/20 0442  WBC 5.7 3.2*  --  4.8  HGB 12.3 9.6* 10.3* 9.7*  HCT 37.8 29.3* 32.6* 30.2*  MCV 81.8 83.0  --  84.1  PLT 252 157  --  139*   Cardiac Enzymes: No results for input(s): CKTOTAL, CKMB, CKMBINDEX, TROPONINI in the last 168 hours. BNP: Invalid input(s): POCBNP CBG: No results for input(s): GLUCAP in the last 168 hours. D-Dimer No results for input(s): DDIMER in the last 72 hours. Hgb A1c No results for input(s): HGBA1C in  the last 72 hours. Lipid Profile No results for input(s): CHOL, HDL, LDLCALC, TRIG, CHOLHDL, LDLDIRECT in the last 72 hours. Thyroid function studies No results for input(s): TSH, T4TOTAL, T3FREE, THYROIDAB in the last 72 hours.  Invalid input(s): FREET3 Anemia work up Recent Labs    09/02/20 1109  FERRITIN 69  TIBC 237*  IRON 67   Urinalysis    Component Value Date/Time   COLORURINE AMBER (A) 09/01/2020 1444   APPEARANCEUR CLEAR 09/01/2020 1444   LABSPEC 1.028 09/01/2020 1444   LABSPEC 1.015 07/05/2018 1404   PHURINE 5.0 09/01/2020 1444   GLUCOSEU NEGATIVE 09/01/2020 1444   HGBUR SMALL (A) 09/01/2020 1444   BILIRUBINUR NEGATIVE 09/01/2020 1444   BILIRUBINUR negative 07/05/2018 1404   KETONESUR 5 (A) 09/01/2020 1444   PROTEINUR 100 (A) 09/01/2020 1444   UROBILINOGEN 1.0 05/07/2015 2010   NITRITE NEGATIVE 09/01/2020 1444   LEUKOCYTESUR NEGATIVE 09/01/2020 1444   Sepsis Labs Invalid input(s): PROCALCITONIN,  WBC,  LACTICIDVEN Microbiology Recent Results (from the past 240 hour(s))  Resp Panel by RT-PCR (Flu A&B, Covid) Nasopharyngeal Swab     Status: None   Collection Time: 09/01/20  3:47 PM   Specimen: Nasopharyngeal Swab; Nasopharyngeal(NP) swabs in vial transport medium  Result Value Ref Range Status   SARS Coronavirus 2 by RT PCR NEGATIVE NEGATIVE Final    Comment: (NOTE) SARS-CoV-2 target nucleic acids are NOT DETECTED.  The SARS-CoV-2 RNA is generally detectable in upper respiratory specimens during the  acute phase of infection. The lowest concentration of SARS-CoV-2 viral copies this assay can detect is 138 copies/mL. A negative result does not preclude SARS-Cov-2 infection and should not be used as the sole basis for treatment or other patient management decisions. A negative result may occur with  improper specimen collection/handling, submission of specimen other than nasopharyngeal swab, presence of viral mutation(s) within the areas targeted by this assay, and inadequate number of viral copies(<138 copies/mL). A negative result must be combined with clinical observations, patient history, and epidemiological information. The expected result is Negative.  Fact Sheet for Patients:  EntrepreneurPulse.com.au  Fact Sheet for Healthcare Providers:  IncredibleEmployment.be  This test is no t yet approved or cleared by the [place] FDA and  has been authorized for detection and/or diagnosis of SARS-CoV-2 by FDA under an Emergency Use Authorization (EUA). This EUA will remain  in effect (meaning this test can be used) for the duration of the COVID-19 declaration under Section 564(b)(1) of the Act, 21 U.S.C.section 360bbb-3(b)(1), unless the authorization is terminated  or revoked sooner.       Influenza A by PCR NEGATIVE NEGATIVE Final   Influenza B by PCR NEGATIVE NEGATIVE Final    Comment: (NOTE) The Xpert Xpress SARS-CoV-2/FLU/RSV plus assay is intended as an aid in the diagnosis of influenza from Nasopharyngeal swab specimens and should not be used as a sole basis for treatment. Nasal washings and aspirates are unacceptable for Xpert Xpress SARS-CoV-2/FLU/RSV testing.  Fact Sheet for Patients: EntrepreneurPulse.com.au  Fact Sheet for Healthcare Providers: IncredibleEmployment.be  This test is not yet approved or cleared by the [place] FDA and has been authorized for detection and/or  diagnosis of SARS-CoV-2 by FDA under an Emergency Use Authorization (EUA). This EUA will remain in effect (meaning this test can be used) for the duration of the COVID-19 declaration under Section 564(b)(1) of the Act, 21 U.S.C. section 360bbb-3(b)(1), unless the authorization is terminated or revoked.  Performed at [facility], [address] [place],  [place] [address]      Time coordinating discharge: 25 minutes  SIGNED: Antonieta Pert, MD  [facility] 09/04/2020, 3:19 PM  If 7PM-7AM, please contact night-coverage [email or web address]

## 2020-09-04 NOTE — Progress Notes (Signed)

## 2020-09-04 NOTE — Progress Notes (Signed)
    2 Days Post-Op  Subjective: Feels much better after 6cc removed from band yesterday.  Tolerated liquids with no further N/V.  No abdominal pain  ROS: See above, otherwise other systems negative  Objective: Vital signs in last 24 hours: Temp:  [98 F (36.7 C)-98.6 F (37 C)] 98 F (36.7 C) (12/03 0503) Pulse Rate:  [61-74] 61 (12/03 0503) Resp:  [17-18] 18 (12/03 0503) BP: (106-120)/(78-89) 110/78 (12/03 0503) SpO2:  [100 %] 100 % (12/03 0503) Weight:  [68.5 kg] 68.5 kg (12/03 0500) Last BM Date: 09/03/20  Intake/Output from previous day: 12/02 0701 - 12/03 0700 In: 240 [P.O.:240] Out: 400 [Urine:400] Intake/Output this shift: No intake/output data recorded.  PE: Heart: regular Lungs: CTAB Abd: soft, NT, Nd, +BS  Lab Results:  Recent Labs    09/02/20 0503 09/02/20 0503 09/02/20 1751 09/03/20 0442  WBC 3.2*  --   --  4.8  HGB 9.6*   < > 10.3* 9.7*  HCT 29.3*   < > 32.6* 30.2*  PLT 157  --   --  139*   < > = values in this interval not displayed.   BMET Recent Labs    09/03/20 0442 09/04/20 0417  NA 141 136  K 3.6 3.7  CL 108 103  CO2 21* 26  GLUCOSE 80 93  BUN 24* 15  CREATININE 1.08* 1.07*  CALCIUM 8.9 8.5*   PT/INR No results for input(s): LABPROT, INR in the last 72 hours. CMP     Component Value Date/Time   NA 136 09/04/2020 0417   NA 140 03/25/2020 0930   K 3.7 09/04/2020 0417   CL 103 09/04/2020 0417   CO2 26 09/04/2020 0417   GLUCOSE 93 09/04/2020 0417   BUN 15 09/04/2020 0417   BUN 16 03/25/2020 0930   CREATININE 1.07 (H) 09/04/2020 0417   CALCIUM 8.5 (L) 09/04/2020 0417   PROT 5.9 (L) 09/02/2020 0503   PROT 7.7 03/25/2020 0930   ALBUMIN 3.4 (L) 09/02/2020 0503   ALBUMIN 4.4 03/25/2020 0930   AST 13 (L) 09/02/2020 0503   ALT 11 09/02/2020 0503   ALKPHOS 29 (L) 09/02/2020 0503   BILITOT 1.5 (H) 09/02/2020 0503   BILITOT 0.6 03/25/2020 0930   GFRNONAA >60 09/04/2020 0417   GFRAA 82 03/25/2020 0930   Lipase     Component  Value Date/Time   LIPASE 34 09/01/2020 1319       Studies/Results: No results found.  Anti-infectives: Anti-infectives (From admission, onward)   None       Assessment/Plan N/V likely secondary to slipped lap band  -feels better after fluid removed -UGI pending to evaluate for esophageal motility, erosion, correction of slippage, etc -patient wants her band out, but as Dr. Hassell Done stated yesterday this typically has to be submitted to insurance for approval and documentation of the indications prior to just removing it. -will await UGI to determine further plans and diet  FEN - FLD/ensure/boost breeze VTE - ok for chemical prophylaxis from our standpoint ID - none currently   LOS: 2 days    Henreitta Cea , [facility] Surgery 09/04/2020, 8:46 AM Please see Amion for pager number during day hours 7:00am-4:30pm or 7:00am -11:30am on weekends

## 2020-09-04 NOTE — Plan of Care (Signed)

## 2020-09-07 ENCOUNTER — Telehealth: Payer: Self-pay

## 2020-09-07 NOTE — Telephone Encounter (Signed)
I called pt. Per the [facility] report to get her scheduled for a hospital f/u. She is already scheduled on 09/21/20 for a med check she wants to just come in then instead of scheduling another rapt. I went over the pts. Medication and got them reconciled.

## 2020-09-20 NOTE — Progress Notes (Signed)
   Subjective:    Patient ID: Anna Zimmerman, female    DOB: [birth year], 44 y.o.   MRN: [id number]  HPI Chief Complaint  Patient presents with  . med check    Med check, hospitalized due to lapband slipping-    She is here for a 6 month medication management visit.  She has a form for her insurance that she would like for me to fill out today.  Recent procedure to remove the water from her lapband and under the care of GI and bariatric surgeon.  She is feeling better, eating and drinking fine. Has gained 20 lbs since having the gastric band relaxed.  States she will have surgery but it is not scheduled until October 17 2019.   Anemia- states she is not losing blood. She was vomiting blood on 09/01/2020.  Taking iron daily. 1 dose.   HTN- taking HCTZ. States her surgeon stopped lisinopril.   Vitamin D def- 16.1 in June 2021. States she is not currently on a supplement.   Post menopausal. Sees OB/GYN   Denies fever, chills, dizziness, fatigue, chest pain, palpitations, shortness of breath, abdominal pain, N/V/D, urinary symptoms, LE edema.   Gums bleed occasionally, just slightly.   Covid vaccines received and last one was March 2021. Needs her booster.   Reviewed allergies, medications, past medical, surgical, family, and social history.    Review of Systems Pertinent positives and negatives in the history of present illness.     Objective:   Physical Exam BP 120/80   Pulse 74   Wt 167 lb 3.2 oz (75.8 kg)   LMP 12/16/2014 (Approximate)   BMI 24.69 kg/m   Alert and in no distress. Cardiac exam shows a regular sinus rhythm without murmurs or gallops. Lungs are clear to auscultation.  Extremities without edema.  Skin is warm and dry, no pallor.  Palpebral fissures are pink.  Palmar creases are also pink.       Assessment & Plan:  Primary hypertension - Plan: CBC with Differential/Platelet, Comprehensive metabolic panel -Blood pressure well controlled.  Currently  only taking HCTZ 25 mg daily.  She had low potassium in late November.  Discussed that she may need to be on a potassium supplement depending on her labs today.  Vitamin D deficiency - Plan: VITAMIN D 25 Hydroxy (Vit-D Deficiency, Fractures) -She is not currently taking a vitamin supplement.  I did prescribe high-dose vitamin D for her to take once weekly which she completed in August or September.  Most likely she will still have low vitamin D.  Follow-up pending labs.  History of laparoscopic adjustable gastric banding -Reports feeling much better since LAP-BAND relaxed.  Eating and drinking without issues and gaining weight that she has lost.  Has upcoming appointment to have lap band removed.  Iron deficiency anemia, unspecified iron deficiency anemia type - Plan: CBC with Differential/Platelet, Comprehensive metabolic panel, Iron, TIBC and Ferritin Panel -May be due to recent hematemesis.  Denies GI or vaginal bleeding.  She is taking an iron supplement.  Check CBC, iron studies and follow-up.  Vaccine counseling -We do not have her Covid vaccine dates so she will call back with these or present them when she schedules her Covid booster.  She declines flu vaccine.

## 2020-09-21 ENCOUNTER — Ambulatory Visit (INDEPENDENT_AMBULATORY_CARE_PROVIDER_SITE_OTHER): Payer: 59 | Admitting: Family Medicine

## 2020-09-21 ENCOUNTER — Encounter: Payer: Self-pay | Admitting: Family Medicine

## 2020-09-21 ENCOUNTER — Other Ambulatory Visit: Payer: Self-pay

## 2020-09-21 VITALS — BP 120/80 | HR 74 | Wt 167.2 lb

## 2020-09-21 DIAGNOSIS — E559 Vitamin D deficiency, unspecified: Secondary | ICD-10-CM | POA: Diagnosis not present

## 2020-09-21 DIAGNOSIS — Z9884 Bariatric surgery status: Secondary | ICD-10-CM

## 2020-09-21 DIAGNOSIS — I10 Essential (primary) hypertension: Secondary | ICD-10-CM

## 2020-09-21 DIAGNOSIS — D509 Iron deficiency anemia, unspecified: Secondary | ICD-10-CM

## 2020-09-21 DIAGNOSIS — Z7185 Encounter for immunization safety counseling: Secondary | ICD-10-CM

## 2020-09-21 NOTE — Patient Instructions (Signed)
Anemia  Anemia is a condition in which you do not have enough red blood cells or hemoglobin. Hemoglobin is a substance in red blood cells that carries oxygen. When you do not have enough red blood cells or hemoglobin (are anemic), your body cannot get enough oxygen and your organs may not work properly. As a result, you may feel very tired or have other problems. What are the causes? Common causes of anemia include:  Excessive bleeding. Anemia can be caused by excessive bleeding inside or outside the body, including bleeding from the intestine or from periods in women.  Poor nutrition.  Long-lasting (chronic) kidney, thyroid, and liver disease.  Bone marrow disorders.  Cancer and treatments for cancer.  HIV (human immunodeficiency virus) and AIDS (acquired immunodeficiency syndrome).  Treatments for HIV and AIDS.  Spleen problems.  Blood disorders.  Infections, medicines, and autoimmune disorders that destroy red blood cells. What are the signs or symptoms? Symptoms of this condition include:  Minor weakness.  Dizziness.  Headache.  Feeling heartbeats that are irregular or faster than normal (palpitations).  Shortness of breath, especially with exercise.  Paleness.  Cold sensitivity.  Indigestion.  Nausea.  Difficulty sleeping.  Difficulty concentrating. Symptoms may occur suddenly or develop slowly. If your anemia is mild, you may not have symptoms. How is this diagnosed? This condition is diagnosed based on:  Blood tests.  Your medical history.  A physical exam.  Bone marrow biopsy. Your health care provider may also check your stool (feces) for blood and may do additional testing to look for the cause of your bleeding. You may also have other tests, including:  Imaging tests, such as a CT scan or MRI.  Endoscopy.  Colonoscopy. How is this treated? Treatment for this condition depends on the cause. If you continue to lose a lot of blood, you may  need to be treated at a hospital. Treatment may include:  Taking supplements of iron, vitamin S31, or folic acid.  Taking a hormone medicine (erythropoietin) that can help to stimulate red blood cell growth.  Having a blood transfusion. This may be needed if you lose a lot of blood.  Making changes to your diet.  Having surgery to remove your spleen. Follow these instructions at home:  Take over-the-counter and prescription medicines only as told by your health care provider.  Take supplements only as told by your health care provider.  Follow any diet instructions that you were given.  Keep all follow-up visits as told by your health care provider. This is important. Contact a health care provider if:  You develop new bleeding anywhere in the body. Get help right away if:  You are very weak.  You are short of breath.  You have pain in your abdomen or chest.  You are dizzy or feel faint.  You have trouble concentrating.  You have bloody or black, tarry stools.  You vomit repeatedly or you vomit up blood. Summary  Anemia is a condition in which you do not have enough red blood cells or enough of a substance in your red blood cells that carries oxygen (hemoglobin).  Symptoms may occur suddenly or develop slowly.  If your anemia is mild, you may not have symptoms.  This condition is diagnosed with blood tests as well as a medical history and physical exam. Other tests may be needed.  Treatment for this condition depends on the cause of the anemia. This information is not intended to replace advice given to you by  your health care provider. Make sure you discuss any questions you have with your health care provider. Document Revised: 09/01/2017 Document Reviewed: 10/21/2016 Elsevier Patient Education  [place].

## 2020-09-22 ENCOUNTER — Other Ambulatory Visit: Payer: Self-pay | Admitting: Internal Medicine

## 2020-09-22 ENCOUNTER — Encounter: Payer: Self-pay | Admitting: Family Medicine

## 2020-09-22 DIAGNOSIS — D709 Neutropenia, unspecified: Secondary | ICD-10-CM

## 2020-09-22 DIAGNOSIS — D649 Anemia, unspecified: Secondary | ICD-10-CM

## 2020-09-22 HISTORY — DX: Neutropenia, unspecified: D70.9

## 2020-09-22 LAB — COMPREHENSIVE METABOLIC PANEL
ALT: 39 IU/L — ABNORMAL HIGH (ref 0–32)
AST: 27 IU/L (ref 0–40)
Albumin/Globulin Ratio: 1.3 (ref 1.2–2.2)
Albumin: 4 g/dL (ref 3.8–4.8)
Alkaline Phosphatase: 51 IU/L (ref 44–121)
BUN/Creatinine Ratio: 24 — ABNORMAL HIGH (ref 9–23)
BUN: 25 mg/dL — ABNORMAL HIGH (ref 6–24)
Bilirubin Total: 0.6 mg/dL (ref 0.0–1.2)
CO2: 22 mmol/L (ref 20–29)
Calcium: 9.8 mg/dL (ref 8.7–10.2)
Chloride: 103 mmol/L (ref 96–106)
Creatinine, Ser: 1.06 mg/dL — ABNORMAL HIGH (ref 0.57–1.00)
GFR calc Af Amer: 74 mL/min/{1.73_m2} (ref >59)
GFR calc non Af Amer: 64 mL/min/{1.73_m2} (ref >59)
Globulin, Total: 3 g/dL (ref 1.5–4.5)
Glucose: 97 mg/dL (ref 65–99)
Potassium: 4.9 mmol/L (ref 3.5–5.2)
Sodium: 139 mmol/L (ref 134–144)
Total Protein: 7 g/dL (ref 6.0–8.5)

## 2020-09-22 LAB — CBC WITH DIFFERENTIAL/PLATELET
Basophils Absolute: 0 10*3/uL (ref 0.0–0.2)
Basos: 1 %
EOS (ABSOLUTE): 0 10*3/uL (ref 0.0–0.4)
Eos: 0 %
Hematocrit: 32 % — ABNORMAL LOW (ref 34.0–46.6)
Hemoglobin: 10.3 g/dL — ABNORMAL LOW (ref 11.1–15.9)
Immature Grans (Abs): 0 10*3/uL (ref 0.0–0.1)
Immature Granulocytes: 0 %
Lymphocytes Absolute: 0.9 10*3/uL (ref 0.7–3.1)
Lymphs: 30 %
MCH: 26.7 pg (ref 26.6–33.0)
MCHC: 32.2 g/dL (ref 31.5–35.7)
MCV: 83 fL (ref 79–97)
Monocytes Absolute: 0.2 10*3/uL (ref 0.1–0.9)
Monocytes: 7 %
Neutrophils Absolute: 1.8 10*3/uL (ref 1.4–7.0)
Neutrophils: 62 %
Platelets: 259 10*3/uL (ref 150–450)
RBC: 3.86 x10E6/uL (ref 3.77–5.28)
RDW: 16.5 % — ABNORMAL HIGH (ref 11.7–15.4)
WBC: 2.9 10*3/uL — ABNORMAL LOW (ref 3.4–10.8)

## 2020-09-22 LAB — IRON,TIBC AND FERRITIN PANEL
Ferritin: 56 ng/mL (ref 15–150)
Iron Saturation: 13 % — ABNORMAL LOW (ref 15–55)
Iron: 45 ug/dL (ref 27–159)
Total Iron Binding Capacity: 352 ug/dL (ref 250–450)
UIBC: 307 ug/dL (ref 131–425)

## 2020-09-22 LAB — VITAMIN D 25 HYDROXY (VIT D DEFICIENCY, FRACTURES): Vit D, 25-Hydroxy: 22.7 ng/mL — ABNORMAL LOW (ref 30.0–100.0)

## 2020-09-22 MED ORDER — FERROUS GLUCONATE 324 (38 FE) MG PO TABS: 324.0000 mg | ORAL_TABLET | Freq: Every day | ORAL | 1 refills | Status: DC

## 2020-09-22 MED ORDER — VITAMIN D (ERGOCALCIFEROL) 1.25 MG (50000 UNIT) PO CAPS: 50000.0000 [IU] | ORAL_CAPSULE | ORAL | 0 refills | Status: DC

## 2020-09-22 NOTE — Progress Notes (Signed)
Her anemia has improved slightly but now her white blood cell count is trending down. I think it is time to have her evaluated by a hematologist. She and I discussed the possibility of this and I would like to refer her now. Please let her know that she will receive a call from the [facility]. Referral needed for anemia unspecified and neutropenia.

## 2020-09-22 NOTE — Progress Notes (Signed)
Ok to send in.  

## 2020-09-22 NOTE — Progress Notes (Signed)
Also, please send in a prescription vitamin D 50,000 IUs once weekly x 8 weeks and let her know.

## 2020-09-23 ENCOUNTER — Telehealth: Payer: Self-pay | Admitting: Adult Health

## 2020-09-23 NOTE — Telephone Encounter (Signed)
Received a new hem referral from Harland Dingwall, NP for neutropenia and anemia. Anna Zimmerman returned my call and has been scheduled to see [name] on 1/10 at 830am w/labs at 8am. Pt aware to arrive 15 minutes early.

## 2020-09-24 ENCOUNTER — Ambulatory Visit (INDEPENDENT_AMBULATORY_CARE_PROVIDER_SITE_OTHER): Payer: 59

## 2020-09-24 ENCOUNTER — Other Ambulatory Visit: Payer: Self-pay

## 2020-09-24 DIAGNOSIS — Z23 Encounter for immunization: Secondary | ICD-10-CM | POA: Diagnosis not present

## 2020-10-12 ENCOUNTER — Inpatient Hospital Stay: Payer: 59

## 2020-10-12 ENCOUNTER — Inpatient Hospital Stay: Payer: 59 | Attending: Adult Health | Admitting: Adult Health

## 2020-10-12 ENCOUNTER — Encounter: Payer: Self-pay | Admitting: Adult Health

## 2020-10-12 ENCOUNTER — Other Ambulatory Visit: Payer: Self-pay

## 2020-10-12 VITALS — BP 145/89 | HR 75 | Temp 97.8°F | Resp 18 | Ht 69.0 in | Wt 174.9 lb

## 2020-10-12 DIAGNOSIS — D704 Cyclic neutropenia: Secondary | ICD-10-CM

## 2020-10-12 DIAGNOSIS — D649 Anemia, unspecified: Secondary | ICD-10-CM

## 2020-10-12 DIAGNOSIS — D72819 Decreased white blood cell count, unspecified: Secondary | ICD-10-CM | POA: Diagnosis not present

## 2020-10-12 DIAGNOSIS — D509 Iron deficiency anemia, unspecified: Secondary | ICD-10-CM

## 2020-10-12 LAB — SAVE SMEAR(SSMR), FOR PROVIDER SLIDE REVIEW

## 2020-10-12 LAB — CBC WITH DIFFERENTIAL (CANCER CENTER ONLY)
Abs Immature Granulocytes: 0.01 10*3/uL (ref 0.00–0.07)
Basophils Absolute: 0 10*3/uL (ref 0.0–0.1)
Basophils Relative: 1 %
Eosinophils Absolute: 0 10*3/uL (ref 0.0–0.5)
Eosinophils Relative: 0 %
HCT: 33.2 % — ABNORMAL LOW (ref 36.0–46.0)
Hemoglobin: 10.8 g/dL — ABNORMAL LOW (ref 12.0–15.0)
Immature Granulocytes: 0 %
Lymphocytes Relative: 22 %
Lymphs Abs: 0.9 10*3/uL (ref 0.7–4.0)
MCH: 27.7 pg (ref 26.0–34.0)
MCHC: 32.5 g/dL (ref 30.0–36.0)
MCV: 85.1 fL (ref 80.0–100.0)
Monocytes Absolute: 0.3 10*3/uL (ref 0.1–1.0)
Monocytes Relative: 6 %
Neutro Abs: 3 10*3/uL (ref 1.7–7.7)
Neutrophils Relative %: 71 %
Platelet Count: 286 10*3/uL (ref 150–400)
RBC: 3.9 MIL/uL (ref 3.87–5.11)
RDW: 16.7 % — ABNORMAL HIGH (ref 11.5–15.5)
WBC Count: 4.2 10*3/uL (ref 4.0–10.5)
nRBC: 0 % (ref 0.0–0.2)

## 2020-10-12 LAB — CMP (CANCER CENTER ONLY)
ALT: 15 U/L (ref 0–44)
AST: 19 U/L (ref 15–41)
Albumin: 3.8 g/dL (ref 3.5–5.0)
Alkaline Phosphatase: 50 U/L (ref 38–126)
Anion gap: 8 (ref 5–15)
BUN: 21 mg/dL — ABNORMAL HIGH (ref 6–20)
CO2: 26 mmol/L (ref 22–32)
Calcium: 10 mg/dL (ref 8.9–10.3)
Chloride: 106 mmol/L (ref 98–111)
Creatinine: 1.18 mg/dL — ABNORMAL HIGH (ref 0.44–1.00)
GFR, Estimated: 59 mL/min — ABNORMAL LOW (ref >60)
Glucose, Bld: 95 mg/dL (ref 70–99)
Potassium: 4.1 mmol/L (ref 3.5–5.1)
Sodium: 140 mmol/L (ref 135–145)
Total Bilirubin: 1 mg/dL (ref 0.3–1.2)
Total Protein: 8.1 g/dL (ref 6.5–8.1)

## 2020-10-12 LAB — FOLATE: Folate: 7.1 ng/mL (ref >5.9)

## 2020-10-12 LAB — IRON AND TIBC
Iron: 49 ug/dL (ref 41–142)
Saturation Ratios: 13 % — ABNORMAL LOW (ref 21–57)
TIBC: 375 ug/dL (ref 236–444)
UIBC: 326 ug/dL (ref 120–384)

## 2020-10-12 LAB — RETIC PANEL
Immature Retic Fract: 8.4 % (ref 2.3–15.9)
RBC.: 3.94 MIL/uL (ref 3.87–5.11)
Retic Count, Absolute: 46.9 10*3/uL (ref 19.0–186.0)
Retic Ct Pct: 1.2 % (ref 0.4–3.1)
Reticulocyte Hemoglobin: 32.1 pg (ref >27.9)

## 2020-10-12 LAB — VITAMIN B12: Vitamin B-12: 385 pg/mL (ref 180–914)

## 2020-10-12 LAB — FERRITIN: Ferritin: 31 ng/mL (ref 11–307)

## 2020-10-12 NOTE — Progress Notes (Addendum)
[facility]  [phone] [phone]     ID: Devanny Palecek DOB: 27-Mar-1976  MR#: [id number]  HWE#:[id number]  Patient Care Team: [name], NP-C as PCP - General (Family Medicine) Scot Dock, NP OTHER MD:  CHIEF COMPLAINT: leukopenia, normocytic anemia  CURRENT TREATMENT: oral iron   HISTORY OF CURRENT ILLNESS: Tiffny was evaluated in the hospital for nausea, vomiting and hematemesis in late November.  She underwent an upper GI that noted her lab band had slipped, causing the difficulty.  This was deflated by surgery, and she was placed on a PPI.  She received electrolyte infusions, and has been doing well since her discharge.    She has been iron deficient for years, and takes oral iron, her most recent colonoscopy and upper endoscopy was completed in 11/2018 by Dr. Henrene Pastor.  No etiology for iron deficiency was found.  A polyp was found and a 5 year repeat was recommended.  COVID-19 vaccination status: fully vaccinated with booster.  INTERVAL HISTORY: Since her hospitalization Melodi has been feeling well.  She had f/u with her PCP who noted normocytic anemia, normal iron studies, and mild leukopenia and referred her to our clinic.    A review of her WBC in Epic notes a relatively normal history of WBCs.  Neutrophils and lymphocytes have also been historically normal.    Results for ARAMIS, ZOBEL (MRN [id number]) as of 10/12/2020 09:40  Ref. Range 12/24/2018 08:59 02/27/2019 09:52 03/25/2019 15:31 03/25/2020 09:30 09/01/2020 13:19 09/02/2020 05:03 09/03/2020 04:42 09/21/2020 09:30  WBC Latest Ref Range: 3.4 - 10.8 x10E3/uL 5.5 3.7 4.2 4.1 5.7 3.2 (L) 4.8 2.9 (L)   REVIEW OF SYSTEMS: Manar is feeling well today.  She denies any symptoms such as fatigue.  She does endorse some night sweats that occur two to three times per week.  She does have some weight loss that happened with her lap band slippage.  Her weight was as low as 140, and  it has increased back to 174.  She has had no abdominal imaging.   She continues to work two jobs, and has had no infections.  She did have her covid booster on 09/24/2020.  She tolerated this well.  A detailed ROS was otherwise non contributory.     PAST MEDICAL HISTORY: Past Medical History:  Diagnosis Date  . Anemia   . Early menopause   . Hypertension   . Neutropenia ([place]) 09/22/2020    PAST SURGICAL HISTORY: Past Surgical History:  Procedure Laterality Date  . BIOPSY  09/02/2020   Procedure: BIOPSY;  Surgeon: Yetta Flock, MD;  Location: [facility];  Service: Gastroenterology;;  . ESOPHAGOGASTRODUODENOSCOPY N/A 09/02/2020   Procedure: ESOPHAGOGASTRODUODENOSCOPY (EGD);  Surgeon: Yetta Flock, MD;  Location: [facility];  Service: Gastroenterology;  Laterality: N/A;  . LAPAROSCOPIC GASTRIC BANDING    . OVARIAN CYST REMOVAL    . right oopherectomy      FAMILY HISTORY Family History  Problem Relation Age of Onset  . Hypertension Father   . Aneurysm Father   . Heart disease Maternal Grandmother   . Kidney disease Maternal Grandmother   . Breast cancer Other        great grandmother  . Colon cancer Neg Hx   . Stomach cancer Neg Hx   . Pancreatic cancer Neg Hx     GYNECOLOGIC HISTORY:  Patient's last menstrual period was 12/16/2014 (approximate). Menarche: 45 years old Age at first live birth: 45 years old [place] P 2  Contraceptive na/ HRT n/a  Hysterectomy? no Salpingo-oophorectomy?RSO    SOCIAL HISTORY: Myleen works two jobs.  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  She has two children, her son is 44 years old, and her daughter is 34.  They live at home with her.  There is no one else living in her home.        ADVANCED DIRECTIVES: Mom is her Erma Heritage [phone]   HEALTH MAINTENANCE: Social History   Tobacco Use  . Smoking  status: Never Smoker  . Smokeless tobacco: Never Used  Vaping Use  . Vaping Use: Never used  Substance Use Topics  . Alcohol use: No  . Drug use: No     Colonoscopy: Dr. Henrene Pastor in 11/2018  PAP: 12/2018  Mammogram: 12/2018  Bone density: n/a   Allergies  Allergen Reactions  . Azithromycin     rash  . Penicillins     Rash Has patient had a PCN reaction causing immediate rash, facial/tongue/throat swelling, SOB or lightheadedness with hypotension: YES Has patient had a PCN reaction causing severe rash involving mucus membranes or skin necrosis:NO Has patient had a PCN reaction that required hospitalizationNO Has patient had a PCN reaction occurring within the last 10 years: NO If all of the above answers are "NO", then may proceed with Cephalosporin use.    Current Outpatient Medications  Medication Sig Dispense Refill  . acetaminophen (TYLENOL) 500 MG tablet Take 1,000 mg by mouth every 8 (eight) hours as needed for mild pain or headache.    . docusate sodium (COLACE) 100 MG capsule Take 1 capsule (100 mg total) by mouth 2 (two) times daily. (Patient taking differently: Take 100 mg by mouth daily.) 10 capsule 0  . ferrous gluconate (FERGON) 324 MG tablet Take 1 tablet (324 mg total) by mouth daily with breakfast. 90 tablet 1  . hydrochlorothiazide (HYDRODIURIL) 25 MG tablet Take 1 tablet (25 mg total) by mouth daily. Start  taking if your blood pressure starts to go above 140s 30 tablet 4  . omeprazole (PRILOSEC) 20 MG capsule Take 1 capsule (20 mg total) by mouth daily. 30 capsule 0  . Vitamin D, Ergocalciferol, (DRISDOL) 1.25 MG (50000 UNIT) CAPS capsule Take 1 capsule (50,000 Units total) by mouth every 7 (seven) days. 8 capsule 0   No current facility-administered medications for this visit.    OBJECTIVE: Vitals:   10/12/20 0814  BP: (!) 145/89  Pulse: 75  Resp: 18  Temp: 97.8 F (36.6 C)  SpO2: 100%     Body mass index is 25.83 kg/m.   Wt Readings from Last 3  Encounters:  10/12/20 174 lb 14.4 oz (79.3 kg)  09/21/20 167 lb 3.2 oz (75.8 kg)  09/04/20 151 lb 0.2 oz (68.5 kg)      ECOG FS:1 - Symptomatic but completely ambulatory  GENERAL: Patient is a well appearing female in no acute distress HEENT:  Sclerae anicteric.  Mask in place.  Neck is supple.  NODES:  No cervical, supraclavicular, or axillary lymphadenopathy palpated.  LUNGS:  Clear to auscultation bilaterally.  No wheezes or rhonchi. HEART:  Regular rate and rhythm. No murmur appreciated. ABDOMEN:  Soft, nontender.  Positive, normoactive bowel sounds. No organomegaly palpated.  Lap band palpable in upper abdomen. MSK:  No focal spinal tenderness to palpation. Full range of motion bilaterally in the upper extremities. EXTREMITIES:  No peripheral  edema.   SKIN:  Clear with no obvious rashes or skin changes. No nail dyscrasia. NEURO:  Nonfocal. Well oriented.  Appropriate affect.     LAB RESULTS:  CMP     Component Value Date/Time   NA 139 09/21/2020 0930   K 4.9 09/21/2020 0930   CL 103 09/21/2020 0930   CO2 22 09/21/2020 0930   GLUCOSE 97 09/21/2020 0930   GLUCOSE 93 09/04/2020 0417   BUN 25 (H) 09/21/2020 0930   CREATININE 1.06 (H) 09/21/2020 0930   CALCIUM 9.8 09/21/2020 0930   PROT 7.0 09/21/2020 0930   ALBUMIN 4.0 09/21/2020 0930   AST 27 09/21/2020 0930   ALT 39 (H) 09/21/2020 0930   ALKPHOS 51 09/21/2020 0930   BILITOT 0.6 09/21/2020 0930   GFRNONAA 64 09/21/2020 0930   GFRNONAA >60 09/04/2020 0417   GFRAA 74 09/21/2020 0930    No results found for: TOTALPROTELP, ALBUMINELP, A1GS, A2GS, BETS, BETA2SER, GAMS, MSPIKE, SPEI  No results found for: KPAFRELGTCHN, LAMBDASER, KAPLAMBRATIO  Lab Results  Component Value Date   WBC 2.9 (L) 09/21/2020   NEUTROABS 1.8 09/21/2020   HGB 10.3 (L) 09/21/2020   HCT 32.0 (L) 09/21/2020   MCV 83 09/21/2020   PLT 259 09/21/2020      Chemistry      Component Value Date/Time   NA 139 09/21/2020 0930   K 4.9  09/21/2020 0930   CL 103 09/21/2020 0930   CO2 22 09/21/2020 0930   BUN 25 (H) 09/21/2020 0930   CREATININE 1.06 (H) 09/21/2020 0930      Component Value Date/Time   CALCIUM 9.8 09/21/2020 0930   ALKPHOS 51 09/21/2020 0930   AST 27 09/21/2020 0930   ALT 39 (H) 09/21/2020 0930   BILITOT 0.6 09/21/2020 0930       No results found for: LABCA2  No components found for: VHQION629  No results for input(s): INR in the last 168 hours.  No results found for: LABCA2  No results found for: BMW413  No results found for: KGM010  No results found for: UVO536  No results found for: CA2729  No components found for: HGQUANT  No results found for: CEA1 / No results found for: CEA1   No results found for: AFPTUMOR  No results found for: CHROMOGRNA  No results found for: PSA1  No visits with results within 3 Day(s) from this visit.  Latest known visit with results is:  Clinical Support on 09/21/2020  Component Date Value Ref Range Status  . WBC 09/21/2020 2.9* 3.4 - 10.8 x10E3/uL Final  . RBC 09/21/2020 3.86  3.77 - 5.28 x10E6/uL Final  . Hemoglobin 09/21/2020 10.3* 11.1 - 15.9 g/dL Final  . Hematocrit 09/21/2020 32.0* 34.0 - 46.6 % Final  . MCV 09/21/2020 83  79 - 97 fL Final  . MCH 09/21/2020 26.7  26.6 - 33.0 pg Final  . MCHC 09/21/2020 32.2  31.5 - 35.7 g/dL Final  . RDW 09/21/2020 16.5* 11.7 - 15.4 % Final  . Platelets 09/21/2020 259  150 - 450 x10E3/uL Final  . Neutrophils 09/21/2020 62  Not Estab. % Final  . Lymphs 09/21/2020 30  Not Estab. % Final  . Monocytes 09/21/2020 7  Not Estab. % Final  . Eos 09/21/2020 0  Not Estab. % Final  . Basos 09/21/2020 1  Not Estab. % Final  . Neutrophils Absolute 09/21/2020 1.8  1.4 - 7.0 x10E3/uL Final  . Lymphocytes Absolute 09/21/2020 0.9  0.7 - 3.1 x10E3/uL Final  .  Monocytes Absolute 09/21/2020 0.2  0.1 - 0.9 x10E3/uL Final  . EOS (ABSOLUTE) 09/21/2020 0.0  0.0 - 0.4 x10E3/uL Final  . Basophils Absolute 09/21/2020 0.0  0.0 -  0.2 x10E3/uL Final  . Immature Granulocytes 09/21/2020 0  Not Estab. % Final  . Immature Grans (Abs) 09/21/2020 0.0  0.0 - 0.1 x10E3/uL Final  . Glucose 09/21/2020 97  65 - 99 mg/dL Final  . BUN 09/21/2020 25* 6 - 24 mg/dL Final  . Creatinine, Ser 09/21/2020 1.06* 0.57 - 1.00 mg/dL Final  . GFR calc non Af Amer 09/21/2020 64  >59 mL/min/1.73 Final  . GFR calc Af Amer 09/21/2020 74  >59 mL/min/1.73 Final   Comment: **In accordance with recommendations from the NKF-ASN Task force,**   [facility] is in the process of updating its eGFR calculation to the   2021 CKD-EPI creatinine equation that estimates kidney function   without a race variable.   . BUN/Creatinine Ratio 09/21/2020 24* 9 - 23 Final  . Sodium 09/21/2020 139  134 - 144 mmol/L Final  . Potassium 09/21/2020 4.9  3.5 - 5.2 mmol/L Final  . Chloride 09/21/2020 103  96 - 106 mmol/L Final  . CO2 09/21/2020 22  20 - 29 mmol/L Final  . Calcium 09/21/2020 9.8  8.7 - 10.2 mg/dL Final  . Total Protein 09/21/2020 7.0  6.0 - 8.5 g/dL Final  . Albumin 09/21/2020 4.0  3.8 - 4.8 g/dL Final  . Globulin, Total 09/21/2020 3.0  1.5 - 4.5 g/dL Final  . Albumin/Globulin Ratio 09/21/2020 1.3  1.2 - 2.2 Final  . Bilirubin Total 09/21/2020 0.6  0.0 - 1.2 mg/dL Final  . Alkaline Phosphatase 09/21/2020 51  44 - 121 IU/L Final                 **Please note reference interval change**  . AST 09/21/2020 27  0 - 40 IU/L Final  . ALT 09/21/2020 39* 0 - 32 IU/L Final  . Vit D, 25-Hydroxy 09/21/2020 22.7* 30.0 - 100.0 ng/mL Final   Comment: Vitamin D deficiency has been defined by the [employer] practice guideline as a level of serum 25-OH vitamin D less than 20 ng/mL (1,2). The Endocrine Society went on to further define vitamin D insufficiency as a level between 21 and 29 ng/mL (2). 1. IOM (Institute of Medicine). 2010. Dietary reference    intakes for calcium and D. [place]: The    [employer]. 2.  Holick MF, [place], Bischoff-Ferrari HA, et al.    Evaluation, treatment, and prevention of vitamin D    deficiency: an Endocrine Society clinical practice    guideline. JCEM. 2011 Jul; 96(7):1911-30.   Marland Kitchen Total Iron Binding Capacity 09/21/2020 352  250 - 450 ug/dL Final  . UIBC 09/21/2020 307  131 - 425 ug/dL Final  . Iron 09/21/2020 45  27 - 159 ug/dL Final  . Iron Saturation 09/21/2020 13* 15 - 55 % Final  . Ferritin 09/21/2020 56  15 - 150 ng/mL Final    (this displays the last labs from the last 3 days)  No results found for: TOTALPROTELP, ALBUMINELP, A1GS, A2GS, BETS, BETA2SER, GAMS, MSPIKE, SPEI (this displays SPEP labs)  No results found for: KPAFRELGTCHN, LAMBDASER, KAPLAMBRATIO (kappa/lambda light chains)  No results found for: HGBA, HGBA2QUANT, HGBFQUANT, HGBSQUAN (Hemoglobinopathy evaluation)   No results found for: LDH  Lab Results  Component Value Date   IRON 45 09/21/2020   TIBC 352 09/21/2020  IRONPCTSAT 13 (L) 09/21/2020   (Iron and TIBC)  Lab Results  Component Value Date   FERRITIN 56 09/21/2020    Urinalysis    Component Value Date/Time   COLORURINE AMBER (A) 09/01/2020 1444   APPEARANCEUR CLEAR 09/01/2020 1444   LABSPEC 1.028 09/01/2020 1444   LABSPEC 1.015 07/05/2018 1404   PHURINE 5.0 09/01/2020 1444   GLUCOSEU NEGATIVE 09/01/2020 1444   HGBUR SMALL (A) 09/01/2020 1444   BILIRUBINUR NEGATIVE 09/01/2020 1444   BILIRUBINUR negative 07/05/2018 1404   KETONESUR 5 (A) 09/01/2020 1444   PROTEINUR 100 (A) 09/01/2020 1444   UROBILINOGEN 1.0 05/07/2015 2010   NITRITE NEGATIVE 09/01/2020 1444   LEUKOCYTESUR NEGATIVE 09/01/2020 1444     STUDIES: No results found.  ASSESSMENT: 45 y.o. [place], [place] woman with normocytic anemia and leukopenia referred for evaluation.    1. Anemia--multifactorial, likely secondary to lab band slippage  (a) iron studies 09/21/2020 normal, repeat pending  (b) B12 and folate are pending  2. Leukopenia  (a)  repeating WBC today  (b) will review blood film    PLAN:  Letita met with myself and Dr. Jana Hakim to discuss her anemia and leukopenia.  We reviewed that all blood cells are made int he bone marrow.  These blood cells include:  1. Red blood cells: oxygen carrying cells, which are slightly decreased, and have been consistently for several years.    2. White blood cells: Immune cells.  These are slightly decreased in number, however she has normal neutrophils and lymphocytes.  We reviewed with Sophiagrace that the lower limit of normal for African Americans is 3.5, not 4.0.    3. Platelets: these are clotting cells.  Donya's are normal.    Today we will draw a CBC, CMET, b12, folate, iron studies, retic, and blood film.  We will review these results and will have a virtual visit with Kesia in one week to review the results.  We will discuss follow-up needs at that time.  She knows to call for any questions that may arise between now and her next appointment.  We are happy to see her sooner if needed.   Total encounter time: 45 minutes  Wilber Bihari, NP 10/12/20 8:58 AM Medical Oncology and Hematology [facility] [place], [place] [address] Tel. [phone]    Fax. [phone]    ADDENDUM: I discussed the fact that we have 3 types of blood cells with Lachelle.  Her platelets are fine.  Her white cells are low today but there is no consistent pattern and we are repeating that (repeat showed a normal count at 4.6, so the earlier very low reading may well have been lab variation).  She understands that for African-Americans a 3.5 White cell count is normal.  She does have moderate anemia.  She is status post lap band.  Patient's in her circumstances frequently have absorption difficulties.  We are checking B12 and folate (both of those came back normal).  We are also checking her iron studies.  Her ferritin is in the low 20s and her saturation is low  at 13%.  Even though she is taking [place] daily I do not think she is absorbing very much.  I think she may well respond to intravenous iron.  I have called her discussed that with her by voicemail (was not able to get through) and have tentatively scheduled her to receive Feraheme 01/19 and the following week.  She would have a visit the same  day, 01/19 to further discuss possible toxicities side effects and complications.  We would then check repeat labs sometime early March.  Total encounter time 55 minutes.  More than 50% of the time was spent by me with the patient and in discussion of complex issues as noted above.   I personally saw this patient and performed a substantive portion of this encounter with the listed APP documented above.   Chauncey Cruel, MD Medical Oncology and Hematology [facility] [address] [place], [place] [address] Tel. [phone]    Fax. [phone]   *Total Encounter Time as defined by the Centers for Medicare and Medicaid Services includes, in addition to the face-to-face time of a patient visit (documented in the note above) non-face-to-face time: obtaining and reviewing outside history, ordering and reviewing medications, tests or procedures, care coordination (communications with other health care professionals or caregivers) and documentation in the medical record.

## 2020-10-13 ENCOUNTER — Other Ambulatory Visit: Payer: Self-pay | Admitting: Oncology

## 2020-10-13 DIAGNOSIS — D509 Iron deficiency anemia, unspecified: Secondary | ICD-10-CM | POA: Insufficient documentation

## 2020-10-13 DIAGNOSIS — D508 Other iron deficiency anemias: Secondary | ICD-10-CM

## 2020-10-16 ENCOUNTER — Telehealth: Payer: Self-pay | Admitting: Oncology

## 2020-10-16 ENCOUNTER — Telehealth: Payer: Self-pay | Admitting: Adult Health

## 2020-10-16 NOTE — Telephone Encounter (Signed)
Rescheduled 1/17 appts per 1/14 email/LC schedule change. Pt confirmed appt cancellation and new appt date and time.

## 2020-10-19 ENCOUNTER — Telehealth: Payer: 59 | Admitting: Adult Health

## 2020-10-21 ENCOUNTER — Other Ambulatory Visit: Payer: Self-pay

## 2020-10-21 ENCOUNTER — Telehealth: Payer: Self-pay | Admitting: Adult Health

## 2020-10-21 ENCOUNTER — Inpatient Hospital Stay: Payer: 59

## 2020-10-21 VITALS — BP 127/86 | HR 71 | Temp 97.7°F | Resp 18 | Wt 177.0 lb

## 2020-10-21 DIAGNOSIS — D72819 Decreased white blood cell count, unspecified: Secondary | ICD-10-CM | POA: Diagnosis not present

## 2020-10-21 DIAGNOSIS — D508 Other iron deficiency anemias: Secondary | ICD-10-CM

## 2020-10-21 MED ORDER — SODIUM CHLORIDE 0.9 % IV SOLN
Freq: Once | INTRAVENOUS | Status: AC
  Filled 2020-10-21: qty 250

## 2020-10-21 MED ORDER — SODIUM CHLORIDE 0.9 % IV SOLN
510.0000 mg | Freq: Once | INTRAVENOUS | Status: AC
  Administered 2020-10-21: 510 mg via INTRAVENOUS
  Filled 2020-10-21: qty 510

## 2020-10-21 NOTE — Patient Instructions (Signed)
Ferric carboxymaltose injection What is this medicine? FERRIC CARBOXYMALTOSE (ferr-ik car-box-ee-mol-toes) is an iron complex. Iron is used to make healthy red blood cells, which carry oxygen and nutrients throughout the body. This medicine is used to treat anemia in people with chronic kidney disease or people who cannot take iron by mouth. This medicine may be used for other purposes; ask your health care provider or pharmacist if you have questions. COMMON BRAND NAME(S): Injectafer What should I tell my health care provider before I take this medicine? They need to know if you have any of these conditions:  high levels of iron in the blood  liver disease  an unusual or allergic reaction to iron, other medicines, foods, dyes, or preservatives  pregnant or trying to get pregnant  breast-feeding How should I use this medicine? This medicine is for infusion into a vein. It is given by a health care professional in a hospital or clinic setting. Talk to your pediatrician regarding the use of this medicine in children. Special care may be needed. Overdosage: If you think you have taken too much of this medicine contact a poison control center or emergency room at once. NOTE: This medicine is only for you. Do not share this medicine with others. What if I miss a dose? Keep appointments for follow-up doses. It is important not to miss your dose. Call your care team if you are unable to keep an appointment. What may interact with this medicine? Do not take this medicine with any of the following medications:  deferoxamine  dimercaprol  other iron products This list may not describe all possible interactions. Give your health care provider a list of all the medicines, herbs, non-prescription drugs, or dietary supplements you use. Also tell them if you smoke, drink alcohol, or use illegal drugs. Some items may interact with your medicine. What should I watch for while using this  medicine? Visit your doctor or health care professional regularly. Tell your doctor if your symptoms do not start to get better or if they get worse. You may need blood work done while you are taking this medicine. You may need to follow a special diet. Talk to your doctor. Foods that contain iron include: whole grains/cereals, dried fruits, beans, or peas, leafy green vegetables, and organ meats (liver, kidney). What side effects may I notice from receiving this medicine? Side effects that you should report to your doctor or health care professional as soon as possible:  allergic reactions like skin rash, itching or hives, swelling of the face, lips, or tongue  dizziness  facial flushing Side effects that usually do not require medical attention (report to your doctor or health care professional if they continue or are bothersome):  changes in taste  constipation  headache  nausea, vomiting  pain, redness, or irritation at site where injected This list may not describe all possible side effects. Call your doctor for medical advice about side effects. You may report side effects to FDA at 1-800-FDA-1088. Where should I keep my medicine? This drug is given in a hospital or clinic and will not be stored at home. NOTE: This sheet is a summary. It may not cover all possible information. If you have questions about this medicine, talk to your doctor, pharmacist, or health care provider.  2021 Elsevier/Gold Standard (2020-09-01 14:00:47)  

## 2020-10-21 NOTE — Telephone Encounter (Signed)
Rescheduled pt's appts per 1/19 pt request. Pt confirmed new appt date and time.

## 2020-10-23 ENCOUNTER — Telehealth: Payer: 59 | Admitting: Adult Health

## 2020-10-27 ENCOUNTER — Encounter (HOSPITAL_COMMUNITY): Payer: Self-pay

## 2020-10-27 ENCOUNTER — Other Ambulatory Visit: Payer: Self-pay

## 2020-10-27 ENCOUNTER — Emergency Department (HOSPITAL_COMMUNITY): Payer: 59

## 2020-10-27 ENCOUNTER — Emergency Department (HOSPITAL_COMMUNITY)
Admission: EM | Admit: 2020-10-27 | Discharge: 2020-10-27 | Disposition: A | Discharge status: Home / Self Care | Payer: 59 | Attending: Emergency Medicine | Admitting: Emergency Medicine

## 2020-10-27 DIAGNOSIS — I10 Essential (primary) hypertension: Secondary | ICD-10-CM | POA: Diagnosis not present

## 2020-10-27 DIAGNOSIS — Z79899 Other long term (current) drug therapy: Secondary | ICD-10-CM | POA: Diagnosis not present

## 2020-10-27 DIAGNOSIS — W009XXA Unspecified fall due to ice and snow, initial encounter: Secondary | ICD-10-CM | POA: Diagnosis not present

## 2020-10-27 DIAGNOSIS — S0083XA Contusion of other part of head, initial encounter: Secondary | ICD-10-CM | POA: Diagnosis not present

## 2020-10-27 DIAGNOSIS — S0990XA Unspecified injury of head, initial encounter: Secondary | ICD-10-CM | POA: Diagnosis present

## 2020-10-27 DIAGNOSIS — S42495A Other nondisplaced fracture of lower end of left humerus, initial encounter for closed fracture: Secondary | ICD-10-CM | POA: Insufficient documentation

## 2020-10-27 MED ORDER — HYDROCODONE-ACETAMINOPHEN 5-325 MG PO TABS: 1.0000 | ORAL_TABLET | Freq: Four times a day (QID) | ORAL | 0 refills | Status: DC | PRN

## 2020-10-27 MED ORDER — HYDROCODONE-ACETAMINOPHEN 5-325 MG PO TABS
1.0000 | ORAL_TABLET | Freq: Once | ORAL | Status: AC
  Administered 2020-10-27: 1 via ORAL
  Filled 2020-10-27: qty 1

## 2020-10-27 NOTE — ED Triage Notes (Signed)
Patient arrived stating she slipped on ice about 3 hours ago causing her to hit her head and her left arm. Declines any LOC.

## 2020-10-27 NOTE — ED Provider Notes (Signed)
[name] DEPT Provider Note: Anna Spurling, MD, FACEP  CSN: [id number] MRN: [id number] ARRIVAL: 10/27/20 at [place]: [facility]   CHIEF COMPLAINT  Fall   HISTORY OF PRESENT ILLNESS  10/27/20 4:04 AM Anna Zimmerman is a 45 y.o. female who slipped on ice about 3 hours prior to arrival.  She hit her head and has a hematoma above her left eye.  She also has pain in her left elbow.  She rates her pain is a 7 out of 10, aching in nature and worse with movement.  She did not have a loss of consciousness and has not been vomiting.  She denies neck pain.   Past Medical History:  Diagnosis Date  . Anemia   . Early menopause   . Hypertension   . Neutropenia ([place]) 09/22/2020    Past Surgical History:  Procedure Laterality Date  . BIOPSY  09/02/2020   Procedure: BIOPSY;  Surgeon: Yetta Flock, MD;  Location: [facility];  Service: Gastroenterology;;  . ESOPHAGOGASTRODUODENOSCOPY N/A 09/02/2020   Procedure: ESOPHAGOGASTRODUODENOSCOPY (EGD);  Surgeon: Yetta Flock, MD;  Location: [facility];  Service: Gastroenterology;  Laterality: N/A;  . LAPAROSCOPIC GASTRIC BANDING    . OVARIAN CYST REMOVAL    . right oopherectomy      Family History  Problem Relation Age of Onset  . Hypertension Father   . Aneurysm Father   . Heart disease Maternal Grandmother   . Kidney disease Maternal Grandmother   . Breast cancer Other        great grandmother  . Colon cancer Neg Hx   . Stomach cancer Neg Hx   . Pancreatic cancer Neg Hx     Social History   Tobacco Use  . Smoking status: Never Smoker  . Smokeless tobacco: Never Used  Vaping Use  . Vaping Use: Never used  Substance Use Topics  . Alcohol use: No  . Drug use: No    Prior to Admission medications   Medication Sig Start Date End Date Taking? Authorizing Provider  HYDROcodone-acetaminophen (NORCO) 5-325 MG tablet Take 1 tablet by mouth every 6 (six) hours as needed for severe pain. 10/27/20  Yes Jachin Coury,  Marshon Bangs, MD  acetaminophen (TYLENOL) 500 MG tablet Take 1,000 mg by mouth every 8 (eight) hours as needed for mild pain or headache.    [provider]  docusate sodium (COLACE) 100 MG capsule Take 1 capsule (100 mg total) by mouth 2 (two) times daily. Patient taking differently: Take 100 mg by mouth daily. 02/28/19   Henson, Vickie L, NP-C  ferrous gluconate (FERGON) 324 MG tablet Take 1 tablet (324 mg total) by mouth daily with breakfast. 09/22/20   Henson, Vickie L, NP-C  hydrochlorothiazide (HYDRODIURIL) 25 MG tablet Take 1 tablet (25 mg total) by mouth daily. Start  taking if your blood pressure starts to go above 140s 09/08/20   Antonieta Pert, MD  omeprazole (PRILOSEC) 20 MG capsule Take 1 capsule (20 mg total) by mouth daily. 09/04/20 10/04/20  Antonieta Pert, MD  Vitamin D, Ergocalciferol, (DRISDOL) 1.25 MG (50000 UNIT) CAPS capsule Take 1 capsule (50,000 Units total) by mouth every 7 (seven) days. 09/22/20   Henson, Vickie L, NP-C    Allergies Azithromycin and Penicillins   REVIEW OF SYSTEMS  Negative except as noted here or in the History of Present Illness.   PHYSICAL EXAMINATION  Initial Vital Signs Blood pressure (!) 143/106, pulse 74, temperature 97.9 F (36.6 C), temperature source Oral, resp. rate 19, last menstrual  period 12/16/2014, SpO2 99 %.  Examination General: Well-developed, well-nourished female in no acute distress; appearance consistent with age of record HENT: normocephalic; hematoma of left eye brow:    Eyes: pupils equal, round and reactive to light; extraocular muscles intact Neck: supple; nontender Heart: regular rate and rhythm Lungs: clear to auscultation bilaterally Abdomen: soft; nondistended; nontender; bowel sounds present Extremities: No deformity; tenderness and swelling of left elbow, RUE distally neurovascularly intact; pulses normal Neurologic: Awake, alert and oriented; motor function intact in all extremities and symmetric; no facial  droop Skin: Warm and dry Psychiatric: Normal mood and affect   RESULTS  Summary of this visit's results, reviewed and interpreted by myself:   EKG Interpretation  Date/Time:    Ventricular Rate:    PR Interval:    QRS Duration:   QT Interval:    QTC Calculation:   R Axis:     Text Interpretation:        Laboratory Studies: No results found for this or any previous visit (from the past 24 hour(s)). Imaging Studies: DG Forearm Left  Result Date: 10/27/2020 CLINICAL DATA:  Pain status post fall EXAM: LEFT FOREARM - 2 VIEW COMPARISON:  None. FINDINGS: There is a subtle lucency coursing through the distal humerus. There may be a joint effusion, however patient positioning limits evaluation. There is no frank dislocation. No radiopaque foreign body. There appears to be some soft tissue swelling about the elbow. IMPRESSION: 1. Subtle lucency coursing through the distal humerus raises concern for a nondisplaced fracture. Dedicated elbow radiographs are recommended. 2. There is soft tissue swelling about the elbow. Electronically Signed   By: Constance Holster M.D.   On: 10/27/2020 02:27   CT Elbow Left Wo Contrast  Result Date: 10/27/2020 CLINICAL DATA:  Left elbow fracture following fall EXAM: CT OF THE UPPER LEFT EXTREMITY WITHOUT CONTRAST TECHNIQUE: Multidetector CT imaging of the upper left extremity was performed according to the standard protocol. COMPARISON:  None. FINDINGS: Bones/Joint/Cartilage There is normal anatomic alignment of the left elbow. There is an acute transverse fracture of the distal left humerus which extends transversely through the a medial epicondyle and obliquely superior to the lateral epicondyle. Fracture fragments are in anatomic alignment. Radiocapitellar and ulnar humeral joint spaces are preserved. No lytic or blastic bone lesion. Ligaments Suboptimally assessed by CT. Muscles and Tendons Normal muscle bulk. Triceps and biceps tendon attachments are intact.  Soft tissues Small left elbow effusion is present. IMPRESSION: Minimally displaced, anatomically aligned transcondylar fracture of the distal left humerus with small associated elbow effusion. Electronically Signed   By: Fidela Salisbury MD   On: 10/27/2020 03:47    ED COURSE and MDM  Nursing notes, initial and subsequent vitals signs, including pulse oximetry, reviewed and interpreted by myself.  Vitals:   10/27/20 0149  BP: (!) 143/106  Pulse: 74  Resp: 19  Temp: 97.9 F (36.6 C)  TempSrc: Oral  SpO2: 99%   Medications  HYDROcodone-acetaminophen (NORCO/VICODIN) 5-325 MG per tablet 1 tablet (has no administration in time range)    Will splint LUE and place in sling with orthopedic referral.   PROCEDURES  Procedures   ED DIAGNOSES     ICD-10-CM   1. Fall due to slipping on ice or snow, initial encounter  W00.9XXA   2. Traumatic hematoma of forehead, initial encounter  S00.83XA   3. Other closed nondisplaced fracture of distal end of left humerus, initial encounter  S42.495A        Alexsia Klindt,  MD 10/27/20 [id number]

## 2020-10-27 NOTE — Progress Notes (Signed)
Orthopedic Tech Progress Note Patient Details:  Anna Zimmerman 05-10-1976 [id number]  Ortho Devices Type of Ortho Device: Arm sling,Post (long arm) splint Ortho Device/Splint Location: lue Ortho Device/Splint Interventions: Ordered,Application,Adjustment   Post Interventions Patient Tolerated: Well Instructions Provided: Care of device,Adjustment of device   Karolee Stamps 10/27/2020, 5:24 AM

## 2020-10-28 ENCOUNTER — Ambulatory Visit: Payer: 59

## 2020-10-29 NOTE — Progress Notes (Signed)
[facility]  [phone] [phone]     ID: Anna Zimmerman DOB: 1976-02-23  MR#: [id number]  KTL#:[id number]  Patient Care Team: Avanell Shackleton, NP-C as PCP - General (Family Medicine) Hilarie Fredrickson, MD as Consulting Physician (Gastroenterology) Magrinat, Valentino Hue, MD as Consulting Physician (Oncology) Sherian Rein, MD as Consulting Physician (Obstetrics and Gynecology) Luretha Murphy, MD as Consulting Physician (General Surgery) Noreene Filbert, NP OTHER MD:  CHIEF COMPLAINT: leukopenia, normocytic anemia  CURRENT TREATMENT: oral iron   HISTORY OF CURRENT ILLNESS: Anna Zimmerman was evaluated in the hospital for nausea, vomiting and hematemesis in late November.  She underwent an upper GI that noted her lab band had slipped, causing the difficulty.  This was deflated by surgery, and she was placed on a PPI.  She received electrolyte infusions, and has been doing well since her discharge.    She has been iron deficient for years, and takes oral iron, her most recent colonoscopy and upper endoscopy was completed in 11/2018 by Dr. Marina Goodell.  No etiology for iron deficiency was found.  A polyp was found and a 5 year repeat was recommended.  COVID-19 vaccination status: fully vaccinated with booster.  INTERVAL HISTORY: Since her last visit we received her results which showed an iron deficiency.  She received Feraheme x 1 last week, and tolerated it quite well.  She has already noted a difference in her energy.  She is here for her second dose today.     REVIEW OF SYSTEMS: Anna Zimmerman is doing quite well today.  She denies any new issues, and has no easy bruising or bleeding.  Unfortunately, since her last visit she fell and fractured her elbow.  She is taking Norco for pain mainly at night and denies any difficulty such as constipation.  She sees ortho next week in f/u.  A detailed ROS was otherwise non contributory today.    PAST MEDICAL  HISTORY: Past Medical History:  Diagnosis Date  . Anemia   . Early menopause   . Hypertension   . Neutropenia (HCC) 09/22/2020    PAST SURGICAL HISTORY: Past Surgical History:  Procedure Laterality Date  . BIOPSY  09/02/2020   Procedure: BIOPSY;  Surgeon: Benancio Deeds, MD;  Location: [facility];  Service: Gastroenterology;;  . ESOPHAGOGASTRODUODENOSCOPY N/A 09/02/2020   Procedure: ESOPHAGOGASTRODUODENOSCOPY (EGD);  Surgeon: Benancio Deeds, MD;  Location: [facility];  Service: Gastroenterology;  Laterality: N/A;  . LAPAROSCOPIC GASTRIC BANDING    . OVARIAN CYST REMOVAL    . right oopherectomy      FAMILY HISTORY Family History  Problem Relation Age of Onset  . Hypertension Father   . Aneurysm Father   . Heart disease Maternal Grandmother   . Kidney disease Maternal Grandmother   . Breast cancer Other        great grandmother  . Colon cancer Neg Hx   . Stomach cancer Neg Hx   . Pancreatic cancer Neg Hx     GYNECOLOGIC HISTORY:  Patient's last menstrual period was 12/16/2014 (approximate). Menarche: 45 years old Age at first live birth: 45 years old GX P 2 Contraceptive na/ HRT n/a  Hysterectomy? no Salpingo-oophorectomy?RSO    SOCIAL HISTORY: Anna Zimmerman works two jobs.  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  She has two children, her son is 50 years old, and her daughter is 74.  They live at  home with her.  There is no one else living in her home.        ADVANCED DIRECTIVES: Mom is her Anna Zimmerman [phone]   HEALTH MAINTENANCE: Social History   Tobacco Use  . Smoking status: Never Smoker  . Smokeless tobacco: Never Used  Vaping Use  . Vaping Use: Never used  Substance Use Topics  . Alcohol use: No  . Drug use: No     Colonoscopy: Dr. Henrene Pastor in 11/2018  PAP: 12/2018  Mammogram: 12/2018  Bone density: n/a   Allergies  Allergen  Reactions  . Azithromycin     rash  . Penicillins     Rash Has patient had a PCN reaction causing immediate rash, facial/tongue/throat swelling, SOB or lightheadedness with hypotension: YES Has patient had a PCN reaction causing severe rash involving mucus membranes or skin necrosis:NO Has patient had a PCN reaction that required hospitalizationNO Has patient had a PCN reaction occurring within the last 10 years: NO If all of the above answers are "NO", then may proceed with Cephalosporin use.    Current Outpatient Medications  Medication Sig Dispense Refill  . acetaminophen (TYLENOL) 500 MG tablet Take 1,000 mg by mouth every 8 (eight) hours as needed for mild pain or headache.    . docusate sodium (COLACE) 100 MG capsule Take 1 capsule (100 mg total) by mouth 2 (two) times daily. (Patient taking differently: Take 100 mg by mouth daily.) 10 capsule 0  . ferrous gluconate (FERGON) 324 MG tablet Take 1 tablet (324 mg total) by mouth daily with breakfast. 90 tablet 1  . hydrochlorothiazide (HYDRODIURIL) 25 MG tablet Take 1 tablet (25 mg total) by mouth daily. Start  taking if your blood pressure starts to go above 140s 30 tablet 4  . HYDROcodone-acetaminophen (NORCO) 5-325 MG tablet Take 1 tablet by mouth every 6 (six) hours as needed for severe pain. 12 tablet 0  . Vitamin D, Ergocalciferol, (DRISDOL) 1.25 MG (50000 UNIT) CAPS capsule Take 1 capsule (50,000 Units total) by mouth every 7 (seven) days. 8 capsule 0  . omeprazole (PRILOSEC) 20 MG capsule Take 1 capsule (20 mg total) by mouth daily. 30 capsule 0   No current facility-administered medications for this visit.    OBJECTIVE: Vitals:   10/30/20 0820  BP: 128/88  Pulse: 74  Resp: 18  Temp: (!) 97.3 F (36.3 C)  SpO2: 100%     Body mass index is 26.3 kg/m.   Wt Readings from Last 3 Encounters:  10/30/20 178 lb 1.6 oz (80.8 kg)  10/21/20 177 lb (80.3 kg)  10/12/20 174 lb 14.4 oz (79.3 kg)      ECOG FS:1 - Symptomatic but  completely ambulatory  GENERAL: Patient is a well appearing female in no acute distress HEENT:  Sclerae anicteric.  Mask in place.  Neck is supple.  NODES:  No cervical, supraclavicular, or axillary lymphadenopathy palpated.  LUNGS:  Clear to auscultation bilaterally.  No wheezes or rhonchi. HEART:  Regular rate and rhythm. No murmur appreciated. ABDOMEN:  Soft, nontender.  Positive, normoactive bowel sounds. No organomegaly palpated.  Lap band palpable in upper abdomen. MSK:  No focal spinal tenderness to palpation. Full range of motion bilaterally in the upper extremities. EXTREMITIES:  No peripheral edema.  Arm in sling. SKIN:  Clear with no obvious rashes or skin changes. No nail dyscrasia. NEURO:  Nonfocal. Well oriented.  Appropriate affect.     LAB RESULTS:  CMP     Component Value Date/Time  NA 140 10/12/2020 0942   NA 139 09/21/2020 0930   K 4.1 10/12/2020 0942   CL 106 10/12/2020 0942   CO2 26 10/12/2020 0942   GLUCOSE 95 10/12/2020 0942   BUN 21 (H) 10/12/2020 0942   BUN 25 (H) 09/21/2020 0930   CREATININE 1.18 (H) 10/12/2020 0942   CALCIUM 10.0 10/12/2020 0942   PROT 8.1 10/12/2020 0942   PROT 7.0 09/21/2020 0930   ALBUMIN 3.8 10/12/2020 0942   ALBUMIN 4.0 09/21/2020 0930   AST 19 10/12/2020 0942   ALT 15 10/12/2020 0942   ALKPHOS 50 10/12/2020 0942   BILITOT 1.0 10/12/2020 0942   GFRNONAA 59 (L) 10/12/2020 0942   GFRAA 74 09/21/2020 0930    No results found for: TOTALPROTELP, ALBUMINELP, A1GS, A2GS, BETS, BETA2SER, GAMS, MSPIKE, SPEI  No results found for: Nils Pyle, [facility]  Lab Results  Component Value Date   WBC 4.2 10/12/2020   NEUTROABS 3.0 10/12/2020   HGB 10.8 (L) 10/12/2020   HCT 33.2 (L) 10/12/2020   MCV 85.1 10/12/2020   PLT 286 10/12/2020      Chemistry      Component Value Date/Time   NA 140 10/12/2020 0942   NA 139 09/21/2020 0930   K 4.1 10/12/2020 0942   CL 106 10/12/2020 0942   CO2 26 10/12/2020 0942    BUN 21 (H) 10/12/2020 0942   BUN 25 (H) 09/21/2020 0930   CREATININE 1.18 (H) 10/12/2020 0942      Component Value Date/Time   CALCIUM 10.0 10/12/2020 0942   ALKPHOS 50 10/12/2020 0942   AST 19 10/12/2020 0942   ALT 15 10/12/2020 0942   BILITOT 1.0 10/12/2020 0942       No results found for: LABCA2  No components found for: QASTMH962  No results for input(s): INR in the last 168 hours.  No results found for: LABCA2  No results found for: IWL798  No results found for: XQJ194  No results found for: RDE081  No results found for: CA2729  No components found for: HGQUANT  No results found for: CEA1 / No results found for: CEA1   No results found for: AFPTUMOR  No results found for: CHROMOGRNA  No results found for: PSA1  No visits with results within 3 Day(s) from this visit.  Latest known visit with results is:  Appointment on 10/12/2020  Component Date Value Ref Range Status  . Retic Ct Pct 10/12/2020 1.2  0.4 - 3.1 % Final  . RBC. 10/12/2020 3.94  3.87 - 5.11 MIL/uL Final  . Retic Count, Absolute 10/12/2020 46.9  19.0 - 186.0 K/uL Final  . Immature Retic Fract 10/12/2020 8.4  2.3 - 15.9 % Final  . Reticulocyte Hemoglobin 10/12/2020 32.1  >27.9 pg Final   Comment:        Given the high negative predictive value of a RET-He result > 32 pg iron deficiency is essentially excluded. If this patient is anemic other etiologies should be considered. Performed at [facility], [place] [address], [place], [place] [address]   . Folate 10/12/2020 7.1  >5.9 ng/mL Final   Performed at [place] [address] [place], [place] [address]  . Vitamin B-12 10/12/2020 385  180 - 914 pg/mL Final   Comment: (NOTE) This assay is not validated for testing neonatal or myeloproliferative syndrome specimens for Vitamin B12 levels. Performed at [facility], [place] [address] [place], [place] [address]   . Ferritin  10/12/2020 31  11 - 307 ng/mL Final   Performed at [facility], [place] [address], [place], [place] [address]  . Iron 10/12/2020 49  41 - 142 ug/dL Final  . TIBC 10/12/2020 375  236 - 444 ug/dL Final  . Saturation Ratios 10/12/2020 13* 21 - 57 % Final  . UIBC 10/12/2020 326  120 - 384 ug/dL Final   Performed at [facility], [place] [address], [place], [place] [address]  . Sodium 10/12/2020 140  135 - 145 mmol/L Final  . Potassium 10/12/2020 4.1  3.5 - 5.1 mmol/L Final  . Chloride 10/12/2020 106  98 - 111 mmol/L Final  . CO2 10/12/2020 26  22 - 32 mmol/L Final  . Glucose, Bld 10/12/2020 95  70 - 99 mg/dL Final   Glucose reference range applies only to samples taken after fasting for at least 8 hours.  . BUN 10/12/2020 21* 6 - 20 mg/dL Final  . Creatinine 10/12/2020 1.18* 0.44 - 1.00 mg/dL Final  . Calcium 10/12/2020 10.0  8.9 - 10.3 mg/dL Final  . Total Protein 10/12/2020 8.1  6.5 - 8.1 g/dL Final  . Albumin 10/12/2020 3.8  3.5 - 5.0 g/dL Final  . AST 10/12/2020 19  15 - 41 U/L Final  . ALT 10/12/2020 15  0 - 44 U/L Final  . Alkaline Phosphatase 10/12/2020 50  38 - 126 U/L Final  . Total Bilirubin 10/12/2020 1.0  0.3 - 1.2 mg/dL Final  . GFR, Estimated 10/12/2020 59* >60 mL/min Final   Comment: (NOTE) Calculated using the CKD-EPI Creatinine Equation (2021)   . Anion gap 10/12/2020 8  5 - 15 Final   Performed at [facility], [place] [address], [place], [place] [address]  . Smear Review 10/12/2020 SMEAR STAINED AND AVAILABLE FOR REVIEW   Final   Performed at [facility], [address], [place], [place] [address]  . WBC Count 10/12/2020 4.2  4.0 - 10.5 K/uL Final  . RBC 10/12/2020 3.90  3.87 - 5.11 MIL/uL Final  . Hemoglobin 10/12/2020 10.8* 12.0 - 15.0 g/dL Final  . HCT 10/12/2020 33.2* 36.0 - 46.0 % Final  . MCV 10/12/2020 85.1  80.0 - 100.0 fL Final  . MCH 10/12/2020 27.7  26.0 - 34.0 pg  Final  . MCHC 10/12/2020 32.5  30.0 - 36.0 g/dL Final  . RDW 10/12/2020 16.7* 11.5 - 15.5 % Final  . Platelet Count 10/12/2020 286  150 - 400 K/uL Final  . nRBC 10/12/2020 0.0  0.0 - 0.2 % Final  . Neutrophils Relative % 10/12/2020 71  % Final  . Neutro Abs 10/12/2020 3.0  1.7 - 7.7 K/uL Final  . Lymphocytes Relative 10/12/2020 22  % Final  . Lymphs Abs 10/12/2020 0.9  0.7 - 4.0 K/uL Final  . Monocytes Relative 10/12/2020 6  % Final  . Monocytes Absolute 10/12/2020 0.3  0.1 - 1.0 K/uL Final  . Eosinophils Relative 10/12/2020 0  % Final  . Eosinophils Absolute 10/12/2020 0.0  0.0 - 0.5 K/uL Final  . Basophils Relative 10/12/2020 1  % Final  . Basophils Absolute 10/12/2020 0.0  0.0 - 0.1 K/uL Final  . Immature Granulocytes 10/12/2020 0  % Final  . Abs Immature Granulocytes 10/12/2020 0.01  0.00 - 0.07 K/uL Final   Performed at [facility], [place] [address], [place], [place] [address]    (this displays the last labs from the last  3 days)  No results found for: TOTALPROTELP, ALBUMINELP, A1GS, A2GS, BETS, BETA2SER, GAMS, MSPIKE, SPEI (this displays SPEP labs)  No results found for: KPAFRELGTCHN, LAMBDASER, KAPLAMBRATIO (kappa/lambda light chains)  No results found for: HGBA, HGBA2QUANT, HGBFQUANT, HGBSQUAN (Hemoglobinopathy evaluation)   No results found for: LDH  Lab Results  Component Value Date   IRON 49 10/12/2020   TIBC 375 10/12/2020   IRONPCTSAT 13 (L) 10/12/2020   (Iron and TIBC)  Lab Results  Component Value Date   FERRITIN 31 10/12/2020    Urinalysis    Component Value Date/Time   COLORURINE AMBER (A) 09/01/2020 1444   APPEARANCEUR CLEAR 09/01/2020 1444   LABSPEC 1.028 09/01/2020 1444   LABSPEC 1.015 07/05/2018 1404   PHURINE 5.0 09/01/2020 1444   GLUCOSEU NEGATIVE 09/01/2020 1444   HGBUR SMALL (A) 09/01/2020 1444   BILIRUBINUR NEGATIVE 09/01/2020 1444   BILIRUBINUR negative 07/05/2018 1404   KETONESUR 5 (A) 09/01/2020 1444    PROTEINUR 100 (A) 09/01/2020 1444   UROBILINOGEN 1.0 05/07/2015 2010   NITRITE NEGATIVE 09/01/2020 [place] 09/01/2020 1444     STUDIES: DG Forearm Left  Result Date: 10/27/2020 CLINICAL DATA:  Pain status post fall EXAM: LEFT FOREARM - 2 VIEW COMPARISON:  None. FINDINGS: There is a subtle lucency coursing through the distal humerus. There may be a joint effusion, however patient positioning limits evaluation. There is no frank dislocation. No radiopaque foreign body. There appears to be some soft tissue swelling about the elbow. IMPRESSION: 1. Subtle lucency coursing through the distal humerus raises concern for a nondisplaced fracture. Dedicated elbow radiographs are recommended. 2. There is soft tissue swelling about the elbow. Electronically Signed   By: Constance Holster M.D.   On: 10/27/2020 02:27   CT Elbow Left Wo Contrast  Result Date: 10/27/2020 CLINICAL DATA:  Left elbow fracture following fall EXAM: CT OF THE UPPER LEFT EXTREMITY WITHOUT CONTRAST TECHNIQUE: Multidetector CT imaging of the upper left extremity was performed according to the standard protocol. COMPARISON:  None. FINDINGS: Bones/Joint/Cartilage There is normal anatomic alignment of the left elbow. There is an acute transverse fracture of the distal left humerus which extends transversely through the a medial epicondyle and obliquely superior to the lateral epicondyle. Fracture fragments are in anatomic alignment. Radiocapitellar and ulnar humeral joint spaces are preserved. No lytic or blastic bone lesion. Ligaments Suboptimally assessed by CT. Muscles and Tendons Normal muscle bulk. Triceps and biceps tendon attachments are intact. Soft tissues Small left elbow effusion is present. IMPRESSION: Minimally displaced, anatomically aligned transcondylar fracture of the distal left humerus with small associated elbow effusion. Electronically Signed   By: Fidela Salisbury MD   On: 10/27/2020 03:47     ASSESSMENT: 45 y.o. Anna Zimmerman, Anna Zimmerman with normocytic anemia and leukopenia referred for evaluation.    1. Anemia--multifactorial, likely secondary to lab band slippage  (a) iron studies slight decrease with saturation at 13%, IV iron given on 1/19 and 1/27  (b) B12 and folate normal  2. Leukopenia  (a) repeat WBC 4.9 (normal)      PLAN:  Ceola is doing quite well today.  She will receive her second dose of Feraheme today.  I reviewed with her that we will check her labs every 3 months and see her back in a year.  So long as everything is stable, she will f/u with [place] in one year.  She understands this.    We discussed her lap band removal.  We talked about cost,  and I encouraged her to find out what her out of pocket max is with her insurance company.  It may make more sense for her to go ahead and have the lap band removed this year, rather than next year.    She is recovering from her recent fall and fracture well and is f/u with ortho as recommended.    She knows to call for any questions that may arise between now and her next appointment.  We are happy to see her sooner if needed.   Total encounter time: 30 minutes  Wilber Bihari, NP 11/01/20 10:45 AM Medical Oncology and Hematology [facility] [place],  [address] Tel. [phone]    Fax. [phone]

## 2020-10-30 ENCOUNTER — Inpatient Hospital Stay (HOSPITAL_BASED_OUTPATIENT_CLINIC_OR_DEPARTMENT_OTHER): Payer: 59 | Admitting: Adult Health

## 2020-10-30 ENCOUNTER — Encounter: Payer: Self-pay | Admitting: Adult Health

## 2020-10-30 ENCOUNTER — Other Ambulatory Visit: Payer: Self-pay

## 2020-10-30 ENCOUNTER — Inpatient Hospital Stay: Payer: 59

## 2020-10-30 VITALS — BP 128/88 | HR 74 | Temp 97.3°F | Resp 18 | Ht 69.0 in | Wt 178.1 lb

## 2020-10-30 VITALS — BP 112/80 | HR 81 | Temp 98.7°F | Resp 18

## 2020-10-30 DIAGNOSIS — D72819 Decreased white blood cell count, unspecified: Secondary | ICD-10-CM | POA: Diagnosis not present

## 2020-10-30 DIAGNOSIS — D508 Other iron deficiency anemias: Secondary | ICD-10-CM | POA: Diagnosis not present

## 2020-10-30 MED ORDER — SODIUM CHLORIDE 0.9 % IV SOLN
510.0000 mg | Freq: Once | INTRAVENOUS | Status: AC
  Administered 2020-10-30: 510 mg via INTRAVENOUS
  Filled 2020-10-30: qty 510

## 2020-10-30 MED ORDER — SODIUM CHLORIDE 0.9 % IV SOLN
Freq: Once | INTRAVENOUS | Status: AC
  Filled 2020-10-30: qty 250

## 2020-10-30 NOTE — Progress Notes (Signed)
Pt declined to stay the 30 min post-infusion observation, VSS and pt stable at discharge. Ambulatory to lobby, all questions answered to pt satisfaction.

## 2020-10-30 NOTE — Patient Instructions (Signed)

## 2020-11-06 ENCOUNTER — Telehealth: Payer: Self-pay | Admitting: Oncology

## 2020-11-06 ENCOUNTER — Other Ambulatory Visit: Payer: Self-pay | Admitting: Family Medicine

## 2020-11-06 DIAGNOSIS — K219 Gastro-esophageal reflux disease without esophagitis: Secondary | ICD-10-CM

## 2020-11-06 DIAGNOSIS — D508 Other iron deficiency anemias: Secondary | ICD-10-CM

## 2020-11-06 DIAGNOSIS — I10 Essential (primary) hypertension: Secondary | ICD-10-CM

## 2020-11-06 NOTE — Telephone Encounter (Signed)
Informed patient of her upcoming appointments. Patient is aware. 

## 2020-11-22 ENCOUNTER — Other Ambulatory Visit: Payer: Self-pay | Admitting: Family Medicine

## 2020-11-23 NOTE — Telephone Encounter (Signed)
Does pt needs a refill or just take over the counter med

## 2020-11-24 NOTE — Telephone Encounter (Signed)
Pt was advised to take over the counter vitamin d

## 2020-11-24 NOTE — Telephone Encounter (Signed)
I recommend taking OTC vitamin D3 1,000-2,000 IUs daily and we can certainly have her come in to check her vitamin D level as a lab visit if she would like to do so.

## 2020-12-02 ENCOUNTER — Other Ambulatory Visit: Payer: Self-pay

## 2020-12-02 ENCOUNTER — Other Ambulatory Visit: Payer: 59

## 2020-12-02 ENCOUNTER — Encounter: Payer: Self-pay | Admitting: Family Medicine

## 2020-12-02 ENCOUNTER — Ambulatory Visit (INDEPENDENT_AMBULATORY_CARE_PROVIDER_SITE_OTHER): Payer: 59 | Admitting: Family Medicine

## 2020-12-02 VITALS — BP 140/90 | HR 58 | Temp 97.4°F | Wt 190.8 lb

## 2020-12-02 DIAGNOSIS — M79671 Pain in right foot: Secondary | ICD-10-CM

## 2020-12-02 NOTE — Patient Instructions (Signed)
Try wearing different shoes with good arch support.   You can also try over the counter topical pain medication such as Aspercreme with Lidocaine.   Let me know if it is getting worse or not improving.

## 2020-12-02 NOTE — Progress Notes (Signed)
   Subjective:    Patient ID: Anna Zimmerman, female    DOB: 08/20/1976, 45 y.o.   MRN: [id number]  HPI Chief Complaint  Patient presents with  . Foot Pain    Right  foot from great toe to arch.   Complains of right foot x 2 weeks. No injury. Pain is medial from her great toe to mid foot.  Feels like a throbbing pain. Pain is worse at night. It also hurts while walking.  Walking abnormally due to pain.  States she bought a new pair of shoes and started wearing them a week or so prior to the pain.  Her left arm is in a cast due to elbow injury from a fall.  No fever, chills, numbness or tingling.     Review of Systems Pertinent positives and negatives in the history of present illness.     Objective:   Physical Exam BP 140/90   Pulse (!) 58   Temp (!) 97.4 F (36.3 C)   Wt 190 lb 12.8 oz (86.5 kg)   LMP 12/16/2014 (Approximate)   SpO2 100%   BMI 28.18 kg/m   Normal right ankle and foot exam.  No erythema, edema, TTP.  Normal sensation, capillary refill, range of motion and strength.      Assessment & Plan:  Right foot pain  Discussed that her foot exam is unremarkable.  Offered a foot x-ray and she declines.  Discussed changing shoes and wearing shoes with more arch support.  I also discussed trying a topical analgesic with lidocaine.  She will let me know if her symptoms are worsening or not improving over the next week or 2.

## 2021-01-04 ENCOUNTER — Inpatient Hospital Stay: Payer: 59 | Attending: Adult Health

## 2021-01-04 ENCOUNTER — Other Ambulatory Visit: Payer: Self-pay

## 2021-01-04 DIAGNOSIS — D509 Iron deficiency anemia, unspecified: Secondary | ICD-10-CM | POA: Diagnosis not present

## 2021-01-04 DIAGNOSIS — D508 Other iron deficiency anemias: Secondary | ICD-10-CM

## 2021-01-04 DIAGNOSIS — E43 Unspecified severe protein-calorie malnutrition: Secondary | ICD-10-CM | POA: Insufficient documentation

## 2021-01-04 DIAGNOSIS — F1021 Alcohol dependence, in remission: Secondary | ICD-10-CM | POA: Insufficient documentation

## 2021-01-04 DIAGNOSIS — Z8711 Personal history of peptic ulcer disease: Secondary | ICD-10-CM | POA: Diagnosis not present

## 2021-01-04 DIAGNOSIS — Z86711 Personal history of pulmonary embolism: Secondary | ICD-10-CM | POA: Insufficient documentation

## 2021-01-04 DIAGNOSIS — Z7901 Long term (current) use of anticoagulants: Secondary | ICD-10-CM | POA: Insufficient documentation

## 2021-01-04 DIAGNOSIS — Z87891 Personal history of nicotine dependence: Secondary | ICD-10-CM | POA: Diagnosis not present

## 2021-01-04 DIAGNOSIS — C169 Malignant neoplasm of stomach, unspecified: Secondary | ICD-10-CM | POA: Insufficient documentation

## 2021-01-04 LAB — CMP (CANCER CENTER ONLY)
ALT: 13 U/L (ref 0–44)
AST: 16 U/L (ref 15–41)
Albumin: 3.8 g/dL (ref 3.5–5.0)
Alkaline Phosphatase: 61 U/L (ref 38–126)
Anion gap: 10 (ref 5–15)
BUN: 23 mg/dL — ABNORMAL HIGH (ref 6–20)
CO2: 23 mmol/L (ref 22–32)
Calcium: 9.3 mg/dL (ref 8.9–10.3)
Chloride: 105 mmol/L (ref 98–111)
Creatinine: 1.18 mg/dL — ABNORMAL HIGH (ref 0.44–1.00)
GFR, Estimated: 58 mL/min — ABNORMAL LOW (ref >60)
Glucose, Bld: 77 mg/dL (ref 70–99)
Potassium: 4.1 mmol/L (ref 3.5–5.1)
Sodium: 138 mmol/L (ref 135–145)
Total Bilirubin: 1.3 mg/dL — ABNORMAL HIGH (ref 0.3–1.2)
Total Protein: 7.6 g/dL (ref 6.5–8.1)

## 2021-01-04 LAB — CBC WITH DIFFERENTIAL (CANCER CENTER ONLY)
Abs Immature Granulocytes: 0.01 10*3/uL (ref 0.00–0.07)
Basophils Absolute: 0 10*3/uL (ref 0.0–0.1)
Basophils Relative: 1 %
Eosinophils Absolute: 0 10*3/uL (ref 0.0–0.5)
Eosinophils Relative: 1 %
HCT: 36.7 % (ref 36.0–46.0)
Hemoglobin: 12.2 g/dL (ref 12.0–15.0)
Immature Granulocytes: 0 %
Lymphocytes Relative: 37 %
Lymphs Abs: 1.2 10*3/uL (ref 0.7–4.0)
MCH: 28.8 pg (ref 26.0–34.0)
MCHC: 33.2 g/dL (ref 30.0–36.0)
MCV: 86.8 fL (ref 80.0–100.0)
Monocytes Absolute: 0.2 10*3/uL (ref 0.1–1.0)
Monocytes Relative: 8 %
Neutro Abs: 1.7 10*3/uL (ref 1.7–7.7)
Neutrophils Relative %: 53 %
Platelet Count: 203 10*3/uL (ref 150–400)
RBC: 4.23 MIL/uL (ref 3.87–5.11)
RDW: 14.2 % (ref 11.5–15.5)
WBC Count: 3.2 10*3/uL — ABNORMAL LOW (ref 4.0–10.5)
nRBC: 0 % (ref 0.0–0.2)

## 2021-01-04 LAB — RETIC PANEL
Immature Retic Fract: 5.9 % (ref 2.3–15.9)
RBC.: 4.27 MIL/uL (ref 3.87–5.11)
Retic Count, Absolute: 47.8 10*3/uL (ref 19.0–186.0)
Retic Ct Pct: 1.1 % (ref 0.4–3.1)
Reticulocyte Hemoglobin: 33.8 pg (ref >27.9)

## 2021-01-04 LAB — IRON AND TIBC
Iron: 122 ug/dL (ref 41–142)
Saturation Ratios: 46 % (ref 21–57)
TIBC: 265 ug/dL (ref 236–444)
UIBC: 143 ug/dL (ref 120–384)

## 2021-01-04 LAB — FERRITIN: Ferritin: 223 ng/mL (ref 11–307)

## 2021-01-28 ENCOUNTER — Other Ambulatory Visit: Payer: Self-pay

## 2021-01-28 ENCOUNTER — Ambulatory Visit (INDEPENDENT_AMBULATORY_CARE_PROVIDER_SITE_OTHER): Payer: 59

## 2021-01-28 ENCOUNTER — Ambulatory Visit
Admission: EM | Admit: 2021-01-28 | Discharge: 2021-01-28 | Disposition: A | Discharge status: Home / Self Care | Payer: 59 | Attending: Nurse Practitioner | Admitting: Nurse Practitioner

## 2021-01-28 DIAGNOSIS — R2241 Localized swelling, mass and lump, right lower limb: Secondary | ICD-10-CM | POA: Diagnosis not present

## 2021-01-28 DIAGNOSIS — S99191A Other physeal fracture of right metatarsal, initial encounter for closed fracture: Secondary | ICD-10-CM | POA: Diagnosis not present

## 2021-01-28 DIAGNOSIS — M79671 Pain in right foot: Secondary | ICD-10-CM | POA: Diagnosis not present

## 2021-01-28 MED ORDER — IBUPROFEN 600 MG PO TABS: 600.0000 mg | ORAL_TABLET | Freq: Four times a day (QID) | ORAL | 0 refills | Status: DC | PRN

## 2021-01-28 NOTE — Discharge Instructions (Addendum)
A metatarsal fracture is a break in one of the five bones that connect the toes to the rest of the foot.  The bone that connects to the little toe (fifth metatarsal) is most commonly fractured which is what you have.  You have been placed in a splint. Keep the splint clean and do not get it wet. You can cover it with a plastic bag when you take a bath or a shower. Use the crutches as provided Make sure that you do not put any weight on your foot Keep your foot elevated as much as possible  Follow-up with your orthopedics. Call tomorrow to make appointment.

## 2021-01-28 NOTE — ED Provider Notes (Signed)
[facility]    CSN: [id number] Arrival date & time: 01/28/21  1304      History   Chief Complaint Chief Complaint  Patient presents with  . Foot Pain    HPI Anna Zimmerman is a 45 y.o. female.   History of Present Illness  Anna Zimmerman is a 45 y.o. female that complains of pain to the lateral aspect of the right foot without radiation. The patient reports that she was just walking and when she stepped she heard a pop in the lateral aspect of the right foot.  She has been unable to bear weight on the right lower extremity since her injury. She has had some pain and swelling as well. Onset of symptoms was abrupt starting several hours ago. Patient describes pain as throbbing. Pain severity now is 7 /10. Pain is aggravated by movement, weight bearing and palpation. Pain is alleviated by nothing. The patient denies any numbness, tingling, weakness, loss of sensation or loss of motion of the left lower extremity. The patient denies other injuries.  Care prior to arrival consisted of nothing.        Past Medical History:  Diagnosis Date  . Anemia   . Early menopause   . Hypertension   . Neutropenia ([place]) 09/22/2020    Patient Active Problem List   Diagnosis Date Noted  . Iron deficiency anemia 10/13/2020  . Neutropenia ([place]) 09/22/2020  . Malnutrition of moderate degree 09/03/2020  . Acute renal failure ([place]) 09/02/2020  . Gastric polyps   . Gastritis and gastroduodenitis   . Hypokalemia 09/01/2020  . Recurrent vomiting 09/01/2020  . Hematemesis 09/01/2020  . 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 Belching 04/01/2020  . Epigastric abdominal pain 04/01/2020  . Weight loss, unintentional 04/01/2020  . Vitamin D deficiency 03/31/2020  . Early menopause   . Anemia 07/06/2018  . Hypertension 07/05/2018  . Amenorrhea 07/05/2018    Past Surgical History:  Procedure Laterality Date  . BIOPSY  09/02/2020   Procedure: BIOPSY;   Surgeon: Yetta Flock, MD;  Location: [facility];  Service: Gastroenterology;;  . ESOPHAGOGASTRODUODENOSCOPY N/A 09/02/2020   Procedure: ESOPHAGOGASTRODUODENOSCOPY (EGD);  Surgeon: Yetta Flock, MD;  Location: [facility];  Service: Gastroenterology;  Laterality: N/A;  . LAPAROSCOPIC GASTRIC BANDING    . OVARIAN CYST REMOVAL    . right oopherectomy      OB History   No obstetric history on file.      Home Medications    Prior to Admission medications   Medication Sig Start Date End Date Taking? Authorizing Provider  ibuprofen (ADVIL) 600 MG tablet Take 1 tablet (600 mg total) by mouth every 6 (six) hours as needed. 01/28/21  Yes Enrique Sack, FNP  acetaminophen (TYLENOL) 500 MG tablet Take 1,000 mg by mouth every 8 (eight) hours as needed for mild pain or headache.    [provider]  Cholecalciferol (VITAMIN D3 PO) Take 1,000 Int'l Units/day by mouth.    [provider]  docusate sodium (COLACE) 100 MG capsule Take 1 capsule (100 mg total) by mouth 2 (two) times daily. Patient taking differently: Take 100 mg by mouth daily. 02/28/19   Henson, Vickie L, NP-C  ferrous gluconate (FERGON) 324 MG tablet Take 1 tablet (324 mg total) by mouth daily with breakfast. 09/22/20   Henson, Vickie L, NP-C  hydrochlorothiazide (HYDRODIURIL) 25 MG tablet TAKE 1 TABLET(25 MG) BY MOUTH DAILY 11/06/20   Henson, Vickie L, NP-C  omeprazole (PRILOSEC) 20  MG capsule TAKE 1 CAPSULE(20 MG) BY MOUTH DAILY 11/06/20   [name], NP-C    Family History Family History  Problem Relation Age of Onset  . Hypertension Father   . Aneurysm Father   . Heart disease Maternal Grandmother   . Kidney disease Maternal Grandmother   . Breast cancer Other        great grandmother  . Colon cancer Neg Hx   . Stomach cancer Neg Hx   . Pancreatic cancer Neg Hx     Social History Social History   Tobacco Use  . Smoking status: Never Smoker  . Smokeless tobacco: Never Used   Vaping Use  . Vaping Use: Never used  Substance Use Topics  . Alcohol use: No  . Drug use: No     Allergies   Azithromycin and Penicillins   Review of Systems Review of Systems  Musculoskeletal: Positive for arthralgias and gait problem.  All other systems reviewed and are negative.    Physical Exam Triage Vital Signs ED Triage Vitals  Enc Vitals Group     BP 01/28/21 1701 (!) 143/96     Pulse Rate 01/28/21 1701 72     Resp 01/28/21 1701 20     Temp 01/28/21 1701 97.6 F (36.4 C)     Temp Source 01/28/21 1701 Oral     SpO2 01/28/21 1701 98 %     Weight --      Height --      Head Circumference --      Peak Flow --      Pain Score 01/28/21 1707 7     Pain Loc --      Pain Edu? --      Excl. in [place]? --    No data found.  Updated Vital Signs BP (!) 143/96 (BP Location: Left Arm)   Pulse 72   Temp 97.6 F (36.4 C) (Oral)   Resp 20   LMP 12/16/2014 (Approximate)   SpO2 98%   Visual Acuity Right Eye Distance:   Left Eye Distance:   Bilateral Distance:    Right Eye Near:   Left Eye Near:    Bilateral Near:     Physical Exam Vitals reviewed.  Constitutional:      General: She is not in acute distress.    Appearance: Normal appearance.  Cardiovascular:     Rate and Rhythm: Normal rate.  Pulmonary:     Effort: Pulmonary effort is normal.  Musculoskeletal:        General: Normal range of motion.     Cervical back: Normal range of motion.     Right foot: Normal capillary refill. Swelling and bony tenderness present. No deformity. Normal pulse.     Left foot: Normal.       Feet:  Skin:    General: Skin is warm and dry.  Neurological:     General: No focal deficit present.     Mental Status: She is alert and oriented to person, place, and time.  Psychiatric:        Mood and Affect: Mood normal.      UC Treatments / Results  Labs (all labs ordered are listed, but only abnormal results are displayed) Labs Reviewed - No data to  display  EKG   Radiology DG Foot Complete Right  Result Date: 01/28/2021 CLINICAL DATA:  Foot pain and swelling EXAM: RIGHT FOOT COMPLETE - 3+ VIEW COMPARISON:  None. FINDINGS: Oblique fracture of the proximal  fifth metatarsal located approximately 2 cm from the tip of the metatarsal. Soft tissue swelling about the foot. IMPRESSION: Jones type fracture of the proximal fifth metatarsal. Electronically Signed   By: Dahlia Bailiff MD   On: 01/28/2021 17:42    Procedures Splint Application  Date/Time: 01/28/2021 6:18 PM Performed by: Enrique Sack, FNP Authorized by: Enrique Sack, FNP   Consent:    Consent obtained:  Verbal   Consent given by:  Patient   Risks, benefits, and alternatives were discussed: yes     Risks discussed:  Numbness, pain and swelling   Alternatives discussed:  No treatment Universal protocol:    Patient identity confirmed:  Verbally with patient Pre-procedure details:    Distal neurologic exam:  Normal   Distal perfusion: distal pulses strong   Procedure details:    Location:  Foot   Foot location:  R foot   Strapping: no     Splint type:  Short leg   Supplies:  Elastic bandage, cotton padding and fiberglass   Attestation: Splint applied and adjusted personally by me   Post-procedure details:    Distal neurologic exam:  Normal   Distal perfusion: distal pulses strong     Procedure completion:  Tolerated   Post-procedure imaging: not applicable     (including critical care time)  Medications Ordered in UC Medications - No data to display  Initial Impression / Assessment and Plan / UC Course  I have reviewed the triage vital signs and the nursing notes.  Pertinent labs & imaging results that were available during my care of the patient were reviewed by me and considered in my medical decision making (see chart for details).    45 year old female presenting with right foot pain after hearing a pop to the lateral aspect of her right foot  while casually ambulating earlier today.  Patient has been unable to bear weight to the right lower extremity since the injury.  X-ray shows a Jones type fracture of the proximal fifth metatarsal.  Patient placed in short leg Ortho-Glass splint and given crutches.  Nonweightbearing advised.  Elevate extremity is much as possible.  NSAIDs as needed for pain.  Follow-up with her orthopedic as soon as possible.  Patient has her own orthopedics home and she plans to call to arrange follow-up for tomorrow.  Today's evaluation has revealed no signs of a dangerous process. Discussed diagnosis with patient and/or guardian. Patient and/or guardian aware of their diagnosis, possible red flag symptoms to watch out for and need for close follow up. Patient and/or guardian understands verbal and written discharge instructions. Patient and/or guardian comfortable with plan and disposition.  Patient and/or guardian has a clear mental status at this time, good insight into illness (after discussion and teaching) and has clear judgment to make decisions regarding their care  This care was provided during an unprecedented National Emergency due to the Novel Coronavirus (COVID-19) pandemic. COVID-19 infections and transmission risks place heavy strains on healthcare resources.  As this pandemic evolves, our facility, providers, and staff strive to respond fluidly, to remain operational, and to provide care relative to available resources and information. Outcomes are unpredictable and treatments are without well-defined guidelines. Further, the impact of COVID-19 on all aspects of urgent care, including the impact to patients seeking care for reasons other than COVID-19, is unavoidable during this national emergency. At this time of the global pandemic, management of patients has significantly changed, even for non-COVID positive patients given high local and regional COVID  volumes at this time requiring high healthcare system and  resource utilization. The standard of care for management of both COVID suspected and non-COVID suspected patients continues to change rapidly at the local, regional, national, and global levels. This patient was worked up and treated to the best available but ever changing evidence and resources available at this current time.   Documentation was completed with the aid of voice recognition software. Transcription may contain typographical errors.    Final Clinical Impressions(s) / UC Diagnoses   Final diagnoses:  Closed fracture of base of fifth metatarsal bone of right foot at metaphyseal-diaphyseal junction, initial encounter     Discharge Instructions     . A metatarsal fracture is a break in one of the five bones that connect the toes to the rest of the foot.  . The bone that connects to the little toe (fifth metatarsal) is most commonly fractured which is what you have.  . You have been placed in a splint. Keep the splint clean and do not get it wet. You can cover it with a plastic bag when you take a bath or a shower. . Use the crutches as provided . Make sure that you do not put any weight on your foot . Keep your foot elevated as much as possible  . Follow-up with your orthopedics. Call tomorrow to make appointment.     ED Prescriptions    Medication Sig Dispense Auth. Provider   ibuprofen (ADVIL) 600 MG tablet Take 1 tablet (600 mg total) by mouth every 6 (six) hours as needed. 30 tablet Enrique Sack, FNP     PDMP not reviewed this encounter.   Enrique Sack, [place] 01/28/21 1824

## 2021-01-28 NOTE — ED Triage Notes (Signed)
Pt states stepped wrong and heard a pop to top of rt foot. C/o pain and swelling. States hx of pain to rt foot with no injury in the past and saw her PCP.

## 2021-03-16 ENCOUNTER — Encounter: Payer: 59 | Admitting: Family Medicine

## 2021-03-29 ENCOUNTER — Inpatient Hospital Stay: Payer: 59 | Attending: Adult Health

## 2021-06-11 ENCOUNTER — Telehealth: Payer: Self-pay | Admitting: Family Medicine

## 2021-06-11 NOTE — Telephone Encounter (Signed)
Pt called for refills of Fergon. Please send to [facility] [address]. Pt can be reached at [phone]

## 2021-06-21 ENCOUNTER — Inpatient Hospital Stay: Payer: 59 | Attending: Oncology

## 2021-06-21 ENCOUNTER — Other Ambulatory Visit: Payer: Self-pay

## 2021-06-21 DIAGNOSIS — F1021 Alcohol dependence, in remission: Secondary | ICD-10-CM | POA: Diagnosis not present

## 2021-06-21 DIAGNOSIS — Z8711 Personal history of peptic ulcer disease: Secondary | ICD-10-CM | POA: Insufficient documentation

## 2021-06-21 DIAGNOSIS — Z87891 Personal history of nicotine dependence: Secondary | ICD-10-CM | POA: Diagnosis not present

## 2021-06-21 DIAGNOSIS — Z86711 Personal history of pulmonary embolism: Secondary | ICD-10-CM | POA: Insufficient documentation

## 2021-06-21 DIAGNOSIS — D508 Other iron deficiency anemias: Secondary | ICD-10-CM

## 2021-06-21 DIAGNOSIS — E43 Unspecified severe protein-calorie malnutrition: Secondary | ICD-10-CM | POA: Insufficient documentation

## 2021-06-21 DIAGNOSIS — Z7901 Long term (current) use of anticoagulants: Secondary | ICD-10-CM | POA: Diagnosis not present

## 2021-06-21 DIAGNOSIS — D509 Iron deficiency anemia, unspecified: Secondary | ICD-10-CM | POA: Insufficient documentation

## 2021-06-21 DIAGNOSIS — C169 Malignant neoplasm of stomach, unspecified: Secondary | ICD-10-CM | POA: Diagnosis not present

## 2021-06-21 LAB — CBC WITH DIFFERENTIAL (CANCER CENTER ONLY)
Abs Immature Granulocytes: 0.02 10*3/uL (ref 0.00–0.07)
Basophils Absolute: 0 10*3/uL (ref 0.0–0.1)
Basophils Relative: 1 %
Eosinophils Absolute: 0 10*3/uL (ref 0.0–0.5)
Eosinophils Relative: 0 %
HCT: 36.8 % (ref 36.0–46.0)
Hemoglobin: 12.4 g/dL (ref 12.0–15.0)
Immature Granulocytes: 1 %
Lymphocytes Relative: 34 %
Lymphs Abs: 1.2 10*3/uL (ref 0.7–4.0)
MCH: 28.9 pg (ref 26.0–34.0)
MCHC: 33.7 g/dL (ref 30.0–36.0)
MCV: 85.8 fL (ref 80.0–100.0)
Monocytes Absolute: 0.2 10*3/uL (ref 0.1–1.0)
Monocytes Relative: 7 %
Neutro Abs: 2.1 10*3/uL (ref 1.7–7.7)
Neutrophils Relative %: 57 %
Platelet Count: 262 10*3/uL (ref 150–400)
RBC: 4.29 MIL/uL (ref 3.87–5.11)
RDW: 13.5 % (ref 11.5–15.5)
WBC Count: 3.6 10*3/uL — ABNORMAL LOW (ref 4.0–10.5)
nRBC: 0 % (ref 0.0–0.2)

## 2021-06-21 LAB — CMP (CANCER CENTER ONLY)
ALT: 9 U/L (ref 0–44)
AST: 11 U/L — ABNORMAL LOW (ref 15–41)
Albumin: 4.1 g/dL (ref 3.5–5.0)
Alkaline Phosphatase: 59 U/L (ref 38–126)
Anion gap: 8 (ref 5–15)
BUN: 22 mg/dL — ABNORMAL HIGH (ref 6–20)
CO2: 28 mmol/L (ref 22–32)
Calcium: 9.7 mg/dL (ref 8.9–10.3)
Chloride: 107 mmol/L (ref 98–111)
Creatinine: 1.06 mg/dL — ABNORMAL HIGH (ref 0.44–1.00)
GFR, Estimated: >60 mL/min (ref >60)
Glucose, Bld: 96 mg/dL (ref 70–99)
Potassium: 3.8 mmol/L (ref 3.5–5.1)
Sodium: 143 mmol/L (ref 135–145)
Total Bilirubin: 1.2 mg/dL (ref 0.3–1.2)
Total Protein: 7.9 g/dL (ref 6.5–8.1)

## 2021-06-21 LAB — IRON AND TIBC
Iron: 118 ug/dL (ref 41–142)
Saturation Ratios: 42 % (ref 21–57)
TIBC: 284 ug/dL (ref 236–444)
UIBC: 166 ug/dL (ref 120–384)

## 2021-06-21 LAB — RETIC PANEL
Immature Retic Fract: 5.2 % (ref 2.3–15.9)
RBC.: 4.27 MIL/uL (ref 3.87–5.11)
Retic Count, Absolute: 43.1 10*3/uL (ref 19.0–186.0)
Retic Ct Pct: 1 % (ref 0.4–3.1)
Reticulocyte Hemoglobin: 32.4 pg (ref >27.9)

## 2021-06-21 LAB — FERRITIN: Ferritin: 125 ng/mL (ref 11–307)

## 2021-07-01 ENCOUNTER — Other Ambulatory Visit: Payer: Self-pay | Admitting: Oncology

## 2021-07-01 DIAGNOSIS — D649 Anemia, unspecified: Secondary | ICD-10-CM

## 2021-09-13 ENCOUNTER — Ambulatory Visit: Payer: 59 | Admitting: Oncology

## 2021-09-13 ENCOUNTER — Other Ambulatory Visit: Payer: 59

## 2021-09-30 ENCOUNTER — Telehealth: Payer: Self-pay | Admitting: Family Medicine

## 2021-09-30 ENCOUNTER — Encounter: Payer: Self-pay | Admitting: Oncology

## 2021-09-30 DIAGNOSIS — I10 Essential (primary) hypertension: Secondary | ICD-10-CM

## 2021-09-30 MED ORDER — HYDROCHLOROTHIAZIDE 25 MG PO TABS
ORAL_TABLET | ORAL | 0 refills | Status: DC
Start: 2021-09-30 — End: 2021-11-18

## 2021-09-30 NOTE — Telephone Encounter (Signed)
[facility] sent refill request for hydrochliorothiazide 25mg  please send to the [place], [address]

## 2021-09-30 NOTE — Telephone Encounter (Signed)
Refilled for 30 days but patient needs an appt within the next 30 days

## 2021-10-06 ENCOUNTER — Other Ambulatory Visit: Payer: Self-pay

## 2021-10-06 DIAGNOSIS — D508 Other iron deficiency anemias: Secondary | ICD-10-CM

## 2021-10-07 ENCOUNTER — Inpatient Hospital Stay: Payer: Self-pay | Attending: Family Medicine

## 2021-10-07 ENCOUNTER — Telehealth: Payer: Self-pay

## 2021-10-07 ENCOUNTER — Inpatient Hospital Stay: Payer: Self-pay | Admitting: Adult Health

## 2021-10-07 NOTE — Telephone Encounter (Signed)
Called pt and left VM regarding missed appointment and rescheduling.

## 2021-10-26 ENCOUNTER — Other Ambulatory Visit: Payer: Self-pay

## 2021-10-26 ENCOUNTER — Other Ambulatory Visit (INDEPENDENT_AMBULATORY_CARE_PROVIDER_SITE_OTHER): Payer: 59

## 2021-10-26 ENCOUNTER — Encounter: Payer: Self-pay | Admitting: Oncology

## 2021-10-26 ENCOUNTER — Telehealth (INDEPENDENT_AMBULATORY_CARE_PROVIDER_SITE_OTHER): Payer: 59 | Admitting: Family Medicine

## 2021-10-26 ENCOUNTER — Encounter: Payer: Self-pay | Admitting: Family Medicine

## 2021-10-26 VITALS — Ht 70.0 in

## 2021-10-26 DIAGNOSIS — J029 Acute pharyngitis, unspecified: Secondary | ICD-10-CM | POA: Diagnosis not present

## 2021-10-26 DIAGNOSIS — R059 Cough, unspecified: Secondary | ICD-10-CM

## 2021-10-26 LAB — POC COVID19 BINAXNOW: SARS Coronavirus 2 Ag: NEGATIVE

## 2021-10-26 LAB — POCT INFLUENZA A/B
Influenza A, POC: NEGATIVE
Influenza B, POC: NEGATIVE

## 2021-10-26 NOTE — Progress Notes (Signed)
° °  Subjective:    Patient ID: Anna Zimmerman, female    DOB: [birth year], 46 y.o.   MRN: [id number]  HPI Documentation for virtual audio and video telecommunications through [place] encounter: The patient was located at home. 2 patient identifiers used.  The provider was located in the office. The patient did consent to this visit and is aware of possible charges through their insurance for this visit. The other persons participating in this telemedicine service were none. Time spent on call was 3 minutes and in review of previous records >15 minutes total for counseling and coordination of care. This virtual service is not related to other E/M service within previous 7 days.  She states that on Saturday she developed a slight cough, sore throat and myalgias but no fever, chills, earache, smell or taste change.  She has been exposed to the flu.  She needs testing before she can return to work.  Review of Systems     Objective:   Physical Exam Alert and in no distress otherwise not examined       Assessment & Plan:  Sore throat - Plan: Influenza A/B, POC COVID-19

## 2021-10-27 LAB — SARS-COV-2, NAA 2 DAY TAT

## 2021-10-27 LAB — NOVEL CORONAVIRUS, NAA: SARS-CoV-2, NAA: NOT DETECTED

## 2021-11-17 ENCOUNTER — Other Ambulatory Visit: Payer: Self-pay | Admitting: Medical

## 2021-11-17 ENCOUNTER — Other Ambulatory Visit: Payer: 59 | Admitting: Medical

## 2021-11-17 DIAGNOSIS — I10 Essential (primary) hypertension: Secondary | ICD-10-CM

## 2021-11-29 ENCOUNTER — Encounter: Payer: Self-pay | Admitting: Physician Assistant

## 2021-11-29 ENCOUNTER — Ambulatory Visit (INDEPENDENT_AMBULATORY_CARE_PROVIDER_SITE_OTHER): Payer: 59 | Admitting: Physician Assistant

## 2021-11-29 ENCOUNTER — Other Ambulatory Visit: Payer: Self-pay

## 2021-11-29 VITALS — BP 136/74 | HR 82 | Ht 70.0 in | Wt 223.0 lb

## 2021-11-29 DIAGNOSIS — Z23 Encounter for immunization: Secondary | ICD-10-CM | POA: Diagnosis not present

## 2021-11-29 DIAGNOSIS — K219 Gastro-esophageal reflux disease without esophagitis: Secondary | ICD-10-CM | POA: Diagnosis not present

## 2021-11-29 DIAGNOSIS — I10 Essential (primary) hypertension: Secondary | ICD-10-CM

## 2021-11-29 DIAGNOSIS — D508 Other iron deficiency anemias: Secondary | ICD-10-CM | POA: Diagnosis not present

## 2021-11-29 DIAGNOSIS — Z6832 Body mass index (BMI) 32.0-32.9, adult: Secondary | ICD-10-CM

## 2021-11-29 DIAGNOSIS — E66811 Obesity, class 1: Secondary | ICD-10-CM

## 2021-11-29 DIAGNOSIS — E6609 Other obesity due to excess calories: Secondary | ICD-10-CM | POA: Insufficient documentation

## 2021-11-29 HISTORY — DX: Obesity, class 1: E66.811

## 2021-11-29 HISTORY — DX: Body mass index (BMI) 32.0-32.9, adult: Z68.32

## 2021-11-29 HISTORY — DX: Other obesity due to excess calories: E66.09

## 2021-11-29 MED ORDER — OMEPRAZOLE 20 MG PO CPDR: 20.0000 mg | DELAYED_RELEASE_CAPSULE | Freq: Every day | ORAL | 3 refills | Status: DC

## 2021-11-29 MED ORDER — HYDROCHLOROTHIAZIDE 25 MG PO TABS: 25.0000 mg | ORAL_TABLET | Freq: Every day | ORAL | 3 refills | Status: DC

## 2021-11-29 NOTE — Patient Instructions (Signed)
Eat a low salt diet and minimize processed foods (examples canned vegetables, lunch meats, fast food), drink 8 glasses of water a day, exercise 3 - 5 times a week (example walking 30 minutes daily), decrease alcohol intake, decrease smoking or nicotine use, decrease caffeine intake, check blood pressure at home every other day and write it down

## 2021-11-29 NOTE — Progress Notes (Signed)
Acute Office Visit  Subjective:    Patient ID: Anna Zimmerman, female    DOB: July 15, 1976, 46 y.o.   MRN: [id number]  Chief Complaint  Patient presents with   Hypertension    Non fasting med check. No concerns    HPI Patient is in today for a follow up appointment.   Denies any new issues. Is taking HCTZ 25 mg and tolerating it well.   Hematology - Lurline Del MD & Thedore Mins NP ObGyn - Janyth Contes, MD  Past Medical History:  Diagnosis Date   Anemia    Early menopause    Hypertension    Neutropenia ([place]) 09/22/2020    Past Surgical History:  Procedure Laterality Date   BIOPSY  09/02/2020   Procedure: BIOPSY;  Surgeon: Yetta Flock, MD;  Location: [facility];  Service: Gastroenterology;;   ESOPHAGOGASTRODUODENOSCOPY N/A 09/02/2020   Procedure: ESOPHAGOGASTRODUODENOSCOPY (EGD);  Surgeon: Yetta Flock, MD;  Location: [facility];  Service: Gastroenterology;  Laterality: N/A;   LAPAROSCOPIC GASTRIC BANDING     OVARIAN CYST REMOVAL     right oopherectomy      Family History  Problem Relation Age of Onset   Hypertension Father    Aneurysm Father    Heart disease Maternal Grandmother    Kidney disease Maternal Grandmother    Breast cancer Other        great grandmother   Colon cancer Neg Hx    Stomach cancer Neg Hx    Pancreatic cancer Neg Hx     Social History   Socioeconomic History   Marital status: Single    Spouse name: Not on file   Number of children: Not on file   Years of education: Not on file   Highest education level: Not on file  Occupational History   Not on file  Tobacco Use   Smoking status: Never   Smokeless tobacco: Never  Vaping Use   Vaping Use: Never used  Substance and Sexual Activity   Alcohol use: No   Drug use: No   Sexual activity: Not Currently  Other Topics Concern   Not on file  Social History Narrative   Not on file   Social Determinants of Health   Financial Resource Strain: Not  on file  Food Insecurity: Not on file  Transportation Needs: Not on file  Physical Activity: Not on file  Stress: Not on file  Social Connections: Not on file  Intimate Partner Violence: Not on file    Outpatient Medications Prior to Visit  Medication Sig Dispense Refill   acetaminophen (TYLENOL) 500 MG tablet Take 1,000 mg by mouth every 8 (eight) hours as needed for mild pain or headache.     Cholecalciferol (VITAMIN D3 PO) Take 1,000 Int'l Units/day by mouth.     ferrous gluconate (FERGON) 324 MG tablet TAKE 1 TABLET(324 MG) BY MOUTH DAILY WITH BREAKFAST 90 tablet 1   hydrochlorothiazide (HYDRODIURIL) 25 MG tablet TAKE 1 TABLET(25 MG) BY MOUTH DAILY 30 tablet 0   omeprazole (PRILOSEC) 20 MG capsule TAKE 1 CAPSULE(20 MG) BY MOUTH DAILY 30 capsule 5   docusate sodium (COLACE) 100 MG capsule Take 1 capsule (100 mg total) by mouth 2 (two) times daily. (Patient not taking: Reported on 10/26/2021) 10 capsule 0   ibuprofen (ADVIL) 600 MG tablet Take 1 tablet (600 mg total) by mouth every 6 (six) hours as needed. (Patient not taking: Reported on 11/29/2021) 30 tablet 0   No facility-administered medications prior to visit.  Allergies  Allergen Reactions   Azithromycin     rash   Penicillins     Rash Has patient had a PCN reaction causing immediate rash, facial/tongue/throat swelling, SOB or lightheadedness with hypotension: YES Has patient had a PCN reaction causing severe rash involving mucus membranes or skin necrosis:NO Has patient had a PCN reaction that required hospitalizationNO Has patient had a PCN reaction occurring within the last 10 years: NO If all of the above answers are "NO", then may proceed with Cephalosporin use.    Review of Systems  Constitutional:  Negative for activity change and chills.  HENT:  Negative for congestion and voice change.   Eyes:  Negative for pain and redness.  Respiratory:  Negative for cough and wheezing.   Cardiovascular:  Negative for chest  pain.  Gastrointestinal:  Negative for constipation, diarrhea, nausea and vomiting.  Endocrine: Negative for polyuria.  Genitourinary:  Negative for frequency.  Skin:  Negative for color change and rash.  Allergic/Immunologic: Negative for immunocompromised state.  Neurological:  Negative for dizziness.  Psychiatric/Behavioral:  Negative for agitation.       Objective:    Physical Exam Vitals and nursing note reviewed.  Constitutional:      General: She is not in acute distress.    Appearance: Normal appearance. She is not ill-appearing.  HENT:     Head: Normocephalic and atraumatic.     Right Ear: External ear normal.     Left Ear: External ear normal.     Nose: No congestion.  Eyes:     Extraocular Movements: Extraocular movements intact.     Conjunctiva/sclera: Conjunctivae normal.     Pupils: Pupils are equal, round, and reactive to light.  Cardiovascular:     Rate and Rhythm: Normal rate and regular rhythm.     Pulses: Normal pulses.     Heart sounds: Normal heart sounds.  Pulmonary:     Effort: Pulmonary effort is normal.     Breath sounds: Normal breath sounds. No wheezing.  Abdominal:     General: Bowel sounds are normal.     Palpations: Abdomen is soft.  Musculoskeletal:     Cervical back: Normal range of motion and neck supple.     Right lower leg: No edema.     Left lower leg: No edema.  Skin:    General: Skin is warm and dry.     Findings: No bruising.  Neurological:     General: No focal deficit present.     Mental Status: She is alert and oriented to person, place, and time.  Psychiatric:        Mood and Affect: Mood normal.        Behavior: Behavior normal.        Thought Content: Thought content normal.    BP 136/74    Pulse 82    Ht 5\' 10"  (1.778 m)    Wt 223 lb (101.2 kg)    LMP 12/16/2014 (Approximate)    SpO2 99%    BMI 32.00 kg/m   Wt Readings from Last 3 Encounters:  11/29/21 223 lb (101.2 kg)  12/02/20 190 lb 12.8 oz (86.5 kg)  10/30/20  178 lb 1.6 oz (80.8 kg)   BP Readings from Last 10 Encounters:  11/29/21 136/74  01/28/21 (!) 143/96  12/02/20 140/90  10/30/20 112/80  10/30/20 128/88  10/27/20 (!) 143/106  10/21/20 127/86  10/12/20 (!) 145/89  09/21/20 120/80  09/04/20 123/87     Health Maintenance Due  Topic Date Due   PAP SMEAR-Modifier  12/07/2021    There are no preventive care reminders to display for this patient.   Lab Results  Component Value Date   TSH 1.390 03/25/2020   Lab Results  Component Value Date   WBC 3.6 (L) 06/21/2021   HGB 12.4 06/21/2021   HCT 36.8 06/21/2021   MCV 85.8 06/21/2021   PLT 262 06/21/2021   Lab Results  Component Value Date   NA 143 06/21/2021   K 3.8 06/21/2021   CO2 28 06/21/2021   GLUCOSE 96 06/21/2021   BUN 22 (H) 06/21/2021   CREATININE 1.06 (H) 06/21/2021   BILITOT 1.2 06/21/2021   ALKPHOS 59 06/21/2021   AST 11 (L) 06/21/2021   ALT 9 06/21/2021   PROT 7.9 06/21/2021   ALBUMIN 4.1 06/21/2021   CALCIUM 9.7 06/21/2021   ANIONGAP 8 06/21/2021   Lab Results  Component Value Date   CHOL 129 02/27/2019   Lab Results  Component Value Date   HDL 66 02/27/2019   Lab Results  Component Value Date   LDLCALC 52 02/27/2019   Lab Results  Component Value Date   TRIG 54 02/27/2019   Lab Results  Component Value Date   CHOLHDL 2.0 02/27/2019   No results found for: HGBA1C     Assessment & Plan:   Problem List Items Addressed This Visit       Cardiovascular and Mediastinum   Primary hypertension - Primary   Relevant Medications   hydrochlorothiazide (HYDRODIURIL) 25 MG tablet     Digestive   GERD without esophagitis   Relevant Medications   omeprazole (PRILOSEC) 20 MG capsule     Other   Iron deficiency anemia   Other Visit Diagnoses     Need for COVID-19 vaccine       Relevant Orders   Pension scheme manager (Completed)        Meds ordered this encounter  Medications   hydrochlorothiazide  (HYDRODIURIL) 25 MG tablet    Sig: Take 1 tablet (25 mg total) by mouth daily.    Dispense:  90 tablet    Refill:  3    Order Specific Question:   Supervising Provider    Answer:   Denita Lung [[id number]]   omeprazole (PRILOSEC) 20 MG capsule    Sig: Take 1 capsule (20 mg total) by mouth daily.    Dispense:  90 capsule    Refill:  3    Order Specific Question:   Supervising Provider    Answer:   Denita Lung [[id number]]   Return in 06/2022 for annual exam and routine labs.  Irene Pap, PA-C

## 2021-12-10 ENCOUNTER — Encounter: Payer: Self-pay | Admitting: Physician Assistant

## 2021-12-10 ENCOUNTER — Ambulatory Visit (INDEPENDENT_AMBULATORY_CARE_PROVIDER_SITE_OTHER): Payer: 59 | Admitting: Physician Assistant

## 2021-12-10 ENCOUNTER — Other Ambulatory Visit: Payer: Self-pay

## 2021-12-10 VITALS — BP 134/88 | HR 70 | Ht 70.0 in | Wt 222.2 lb

## 2021-12-10 DIAGNOSIS — I10 Essential (primary) hypertension: Secondary | ICD-10-CM

## 2021-12-10 DIAGNOSIS — Z113 Encounter for screening for infections with a predominantly sexual mode of transmission: Secondary | ICD-10-CM

## 2021-12-10 DIAGNOSIS — Z6831 Body mass index (BMI) 31.0-31.9, adult: Secondary | ICD-10-CM | POA: Insufficient documentation

## 2021-12-10 NOTE — Progress Notes (Signed)
? ?Acute Office Visit ? ?Subjective:  ? ? Patient ID: Anna Zimmerman, female    DOB: 05-Jul-1976, 46 y.o.   MRN: [id number] ? ?Chief Complaint  ?Patient presents with  ? Exposure to STD  ?  Pt needs std check. She did not have an exposure just a routine screening.  ? ? ?HPI ?Patient is in today for a follow up appointment. States she would like testing for STI; denies being told that she was exposed to anything; has had the same sexual partner for > 1 year; is not on birth control; uses condoms; denies vaginal discharge or odor; denies rashes or sore throat. ? ?Past Medical History:  ?Diagnosis Date  ? Anemia   ? Early menopause   ? Hypertension   ? Neutropenia ([place]) 09/22/2020  ? ? ?Past Surgical History:  ?Procedure Laterality Date  ? BIOPSY  09/02/2020  ? Procedure: BIOPSY;  Surgeon: Yetta Flock, MD;  Location: [facility];  Service: Gastroenterology;;  ? ESOPHAGOGASTRODUODENOSCOPY N/A 09/02/2020  ? Procedure: ESOPHAGOGASTRODUODENOSCOPY (EGD);  Surgeon: Yetta Flock, MD;  Location: [facility];  Service: Gastroenterology;  Laterality: N/A;  ? LAPAROSCOPIC GASTRIC BANDING    ? OVARIAN CYST REMOVAL    ? right oopherectomy    ? ? ?Family History  ?Problem Relation Age of Onset  ? Hypertension Father   ? Aneurysm Father   ? Heart disease Maternal Grandmother   ? Kidney disease Maternal Grandmother   ? Breast cancer Other   ?     great grandmother  ? Colon cancer Neg Hx   ? Stomach cancer Neg Hx   ? Pancreatic cancer Neg Hx   ? ? ?Social History  ? ?Socioeconomic History  ? Marital status: Single  ?  Spouse name: Not on file  ? Number of children: Not on file  ? Years of education: Not on file  ? Highest education level: Not on file  ?Occupational History  ? Not on file  ?Tobacco Use  ? Smoking status: Never  ? Smokeless tobacco: Never  ?Vaping Use  ? Vaping Use: Never used  ?Substance and Sexual Activity  ? Alcohol use: No  ? Drug use: No  ? Sexual activity: Not Currently  ?Other Topics Concern  ?  Not on file  ?Social History Narrative  ? Not on file  ? ?Social Determinants of Health  ? ?Financial Resource Strain: Not on file  ?Food Insecurity: Not on file  ?Transportation Needs: Not on file  ?Physical Activity: Not on file  ?Stress: Not on file  ?Social Connections: Not on file  ?Intimate Partner Violence: Not on file  ? ? ?Outpatient Medications Prior to Visit  ?Medication Sig Dispense Refill  ? acetaminophen (TYLENOL) 500 MG tablet Take 1,000 mg by mouth every 8 (eight) hours as needed for mild pain or headache.    ? Cholecalciferol (VITAMIN D3 PO) Take 1,000 Int'l Units/day by mouth.    ? ferrous gluconate (FERGON) 324 MG tablet TAKE 1 TABLET(324 MG) BY MOUTH DAILY WITH BREAKFAST 90 tablet 1  ? hydrochlorothiazide (HYDRODIURIL) 25 MG tablet Take 1 tablet (25 mg total) by mouth daily. 90 tablet 3  ? omeprazole (PRILOSEC) 20 MG capsule Take 1 capsule (20 mg total) by mouth daily. 90 capsule 3  ? docusate sodium (COLACE) 100 MG capsule Take 1 capsule (100 mg total) by mouth 2 (two) times daily. (Patient not taking: Reported on 10/26/2021) 10 capsule 0  ? ?No facility-administered medications prior to visit.  ? ? ?Allergies  ?  Allergen Reactions  ? Azithromycin   ?  rash  ? Penicillins   ?  Rash ?Has patient had a PCN reaction causing immediate rash, facial/tongue/throat swelling, SOB or lightheadedness with hypotension: YES ?Has patient had a PCN reaction causing severe rash involving mucus membranes or skin necrosis:NO ?Has patient had a PCN reaction that required hospitalizationNO ?Has patient had a PCN reaction occurring within the last 10 years: NO ?If all of the above answers are "NO", then may proceed with Cephalosporin use.  ? ? ?Review of Systems  ?Constitutional:  Negative for activity change and chills.  ?HENT:  Negative for congestion and voice change.   ?Eyes:  Negative for pain and redness.  ?Respiratory:  Negative for cough and wheezing.   ?Cardiovascular:  Negative for chest pain.   ?Gastrointestinal:  Negative for constipation, diarrhea, nausea and vomiting.  ?Endocrine: Negative for polyuria.  ?Genitourinary:  Negative for frequency.  ?Skin:  Negative for color change and rash.  ?Allergic/Immunologic: Negative for immunocompromised state.  ?Neurological:  Negative for dizziness.  ?Psychiatric/Behavioral:  Negative for agitation.   ? ?   ?Objective:  ?  ?Physical Exam ?Vitals and nursing note reviewed.  ?Constitutional:   ?   General: She is not in acute distress. ?   Appearance: Normal appearance. She is not ill-appearing.  ?HENT:  ?   Head: Normocephalic and atraumatic.  ?   Right Ear: External ear normal.  ?   Left Ear: External ear normal.  ?   Nose: No congestion.  ?Eyes:  ?   Extraocular Movements: Extraocular movements intact.  ?   Conjunctiva/sclera: Conjunctivae normal.  ?   Pupils: Pupils are equal, round, and reactive to light.  ?Cardiovascular:  ?   Rate and Rhythm: Normal rate and regular rhythm.  ?   Pulses: Normal pulses.  ?   Heart sounds: Normal heart sounds.  ?Pulmonary:  ?   Effort: Pulmonary effort is normal.  ?   Breath sounds: Normal breath sounds. No wheezing.  ?Abdominal:  ?   General: Bowel sounds are normal.  ?   Palpations: Abdomen is soft.  ?Musculoskeletal:  ?   Cervical back: Normal range of motion and neck supple.  ?   Right lower leg: No edema.  ?   Left lower leg: No edema.  ?Skin: ?   General: Skin is warm and dry.  ?   Findings: No bruising.  ?Neurological:  ?   General: No focal deficit present.  ?   Mental Status: She is alert and oriented to person, place, and time.  ?Psychiatric:     ?   Mood and Affect: Mood normal.     ?   Behavior: Behavior normal.     ?   Thought Content: Thought content normal.  ? ? ?BP 134/88   Pulse 70   Ht '5\' 10"'$  (1.778 m)   Wt 222 lb 3.2 oz (100.8 kg)   LMP 12/16/2014 (Approximate)   BMI 31.88 kg/m?  ?Wt Readings from Last 3 Encounters:  ?12/10/21 222 lb 3.2 oz (100.8 kg)  ?11/29/21 223 lb (101.2 kg)  ?12/02/20 190 lb 12.8  oz (86.5 kg)  ? ? ?Health Maintenance Due  ?Topic Date Due  ? PAP SMEAR-Modifier  12/07/2021  ? ? ?There are no preventive care reminders to display for this patient. ? ? ?Lab Results  ?Component Value Date  ? TSH 1.390 03/25/2020  ? ?Lab Results  ?Component Value Date  ? WBC 3.6 (L) 06/21/2021  ?  HGB 12.4 06/21/2021  ? HCT 36.8 06/21/2021  ? MCV 85.8 06/21/2021  ? PLT 262 06/21/2021  ? ?Lab Results  ?Component Value Date  ? NA 143 06/21/2021  ? K 3.8 06/21/2021  ? CO2 28 06/21/2021  ? GLUCOSE 96 06/21/2021  ? BUN 22 (H) 06/21/2021  ? CREATININE 1.06 (H) 06/21/2021  ? BILITOT 1.2 06/21/2021  ? ALKPHOS 59 06/21/2021  ? AST 11 (L) 06/21/2021  ? ALT 9 06/21/2021  ? PROT 7.9 06/21/2021  ? ALBUMIN 4.1 06/21/2021  ? CALCIUM 9.7 06/21/2021  ? ANIONGAP 8 06/21/2021  ? ?Lab Results  ?Component Value Date  ? CHOL 129 02/27/2019  ? ?Lab Results  ?Component Value Date  ? HDL 66 02/27/2019  ? ?Lab Results  ?Component Value Date  ? [place] 52 02/27/2019  ? ?Lab Results  ?Component Value Date  ? TRIG 54 02/27/2019  ? ?Lab Results  ?Component Value Date  ? CHOLHDL 2.0 02/27/2019  ? ?No results found for: HGBA1C ? ?   ?Assessment & Plan:  ? ?Problem List Items Addressed This Visit   ?None ?Visit Diagnoses   ? ? Encounter for screening examination for sexually transmitted disease    -  Primary  ? Relevant Orders  ? RPR+HIV+GC+CT Panel  ? Hepatitis C antibody  ? HSV(herpes simplex vrs) 1+2 ab-IgG  ? ?  ? ? ? ?No orders of the defined types were placed in this encounter. ? ?Return for annual exam in 06/2022. ? ?Irene Pap, PA-C ? ?

## 2021-12-13 ENCOUNTER — Encounter: Payer: Self-pay | Admitting: Physician Assistant

## 2021-12-13 ENCOUNTER — Other Ambulatory Visit: Payer: Self-pay | Admitting: Physician Assistant

## 2021-12-13 ENCOUNTER — Telehealth: Payer: Self-pay

## 2021-12-13 DIAGNOSIS — B009 Herpesviral infection, unspecified: Secondary | ICD-10-CM | POA: Insufficient documentation

## 2021-12-13 LAB — RPR+HIV+GC+CT PANEL
Chlamydia trachomatis, NAA: NEGATIVE
HIV Screen 4th Generation wRfx: NONREACTIVE
Neisseria Gonorrhoeae by PCR: NEGATIVE
RPR Ser Ql: NONREACTIVE

## 2021-12-13 LAB — HSV(HERPES SIMPLEX VRS) I + II AB-IGG
HSV 1 Glycoprotein G Ab, IgG: 29.9 index — ABNORMAL HIGH (ref 0.00–0.90)
HSV 2 IgG, Type Spec: 10 index — ABNORMAL HIGH (ref 0.00–0.90)

## 2021-12-13 LAB — HEPATITIS C ANTIBODY: Hep C Virus Ab: NONREACTIVE

## 2021-12-13 MED ORDER — VALACYCLOVIR HCL 500 MG PO TABS
500.0000 mg | ORAL_TABLET | Freq: Two times a day (BID) | ORAL | 1 refills | Status: AC
Start: 2021-12-13 — End: 2021-12-16

## 2021-12-13 NOTE — Telephone Encounter (Signed)
Pt called back and said she would like to have a prescription called in for the herpes and said she does get cold sores every now and then. ?

## 2021-12-13 NOTE — Progress Notes (Signed)
Please call patient to tell her labs revealed: ? ?No syphyllis, No HIV, No gonorrhea, No chlamydia, No Hepatitis C ? ?+ Herpes simplex. The result isn't able to tell how long she has had it or where it came from. If she is having an outbreak, I can prescribe an anti-viral medicine for her.

## 2021-12-13 NOTE — Progress Notes (Signed)
Great.  Thanks

## 2021-12-13 NOTE — Telephone Encounter (Signed)
Prescription for Valtrex sent to [facility] on [facility].

## 2021-12-18 ENCOUNTER — Other Ambulatory Visit: Payer: Self-pay | Admitting: Physician Assistant

## 2021-12-18 DIAGNOSIS — I10 Essential (primary) hypertension: Secondary | ICD-10-CM

## 2022-06-03 ENCOUNTER — Encounter: Payer: 59 | Admitting: Physician Assistant

## 2022-08-01 ENCOUNTER — Encounter: Payer: Self-pay | Admitting: Oncology

## 2022-08-02 ENCOUNTER — Encounter: Payer: Self-pay | Admitting: Oncology

## 2022-12-07 ENCOUNTER — Other Ambulatory Visit: Payer: Self-pay

## 2022-12-07 ENCOUNTER — Telehealth: Payer: Self-pay | Admitting: Nurse Practitioner

## 2022-12-07 DIAGNOSIS — I10 Essential (primary) hypertension: Secondary | ICD-10-CM

## 2022-12-07 DIAGNOSIS — K219 Gastro-esophageal reflux disease without esophagitis: Secondary | ICD-10-CM

## 2022-12-07 MED ORDER — VITAMIN D3 250 MCG (10000 UT) PO CAPS: 1.0000 | ORAL_CAPSULE | Freq: Every day | ORAL | 1 refills | Status: DC

## 2022-12-07 MED ORDER — OMEPRAZOLE 20 MG PO CPDR: 20.0000 mg | DELAYED_RELEASE_CAPSULE | Freq: Every day | ORAL | 0 refills | Status: DC

## 2022-12-07 MED ORDER — HYDROCHLOROTHIAZIDE 25 MG PO TABS: 25.0000 mg | ORAL_TABLET | Freq: Every day | ORAL | 0 refills | Status: DC

## 2022-12-07 NOTE — Telephone Encounter (Signed)
Anna Zimmerman called and scheduled a med check for 01/19/23 and she also needs refills on her vitamin D3, omeprazole, and hydrochlorothzide to  [id number] - [address]

## 2022-12-14 ENCOUNTER — Other Ambulatory Visit: Payer: Self-pay | Admitting: Physician Assistant

## 2022-12-14 ENCOUNTER — Encounter: Payer: Self-pay | Admitting: Physician Assistant

## 2022-12-14 ENCOUNTER — Encounter: Payer: Self-pay | Admitting: Oncology

## 2022-12-14 ENCOUNTER — Ambulatory Visit (INDEPENDENT_AMBULATORY_CARE_PROVIDER_SITE_OTHER): Payer: 59

## 2022-12-14 ENCOUNTER — Ambulatory Visit: Payer: 59 | Admitting: Physician Assistant

## 2022-12-14 DIAGNOSIS — M25572 Pain in left ankle and joints of left foot: Secondary | ICD-10-CM

## 2022-12-14 DIAGNOSIS — M79671 Pain in right foot: Secondary | ICD-10-CM | POA: Diagnosis not present

## 2022-12-14 DIAGNOSIS — M79672 Pain in left foot: Secondary | ICD-10-CM | POA: Diagnosis not present

## 2022-12-14 NOTE — Progress Notes (Signed)
Office Visit Note   Patient: Anna Zimmerman           Date of Birth: Jun 17, 1976           MRN: [id number] Visit Date: 12/14/2022              Requested by: Orma Render, NP [place],  [place] [address] PCP: Orma Render, NP  No chief complaint on file.     HPI: Patient is a pleasant 47 year old woman who comes in today with some left ankle pain.  She denies any injuries.  She denies any change in activity.  She was just concerned because she did have a fracture in her right foot and she wants to make sure she does not have any type of fracture.  Pain is mostly focal over the lateral joint line  Assessment & Plan: Visit Diagnoses:  1. Pain in right foot     Plan: I do not see anything concerning.  I talked her about wearing an ankle support she is getting consider this as well as using topical anti-inflammatories can certainly follow-up if she does not do better  Follow-Up Instructions:   Ortho Exam  Patient is alert, oriented, no adenopathy, well-dressed, normal affect, normal respiratory effort. Examination of her ankle she has good dorsiflexion plantarflexion eversion inversion.  Good endpoint on anterior draw compartments are soft and nontender.  She has some slight tenderness over the lateral ankle joint.  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  No fractures noted   XR Ankle Complete Left  Result Date: 12/14/2022 Radiographs of her ankle were reviewed today.  No acute changes she does have some lateral impingement findings.  Some early degenerative changes   No images are attached to the encounter.  Labs: No results found for: "HGBA1C", "ESRSEDRATE", "CRP", "LABURIC", "REPTSTATUS", "GRAMSTAIN", "CULT", "LABORGA"   Lab Results  Component Value Date   ALBUMIN 4.1 06/21/2021    ALBUMIN 3.8 01/04/2021   ALBUMIN 3.8 10/12/2020    Lab Results  Component Value Date   MG 1.8 09/04/2020   MG 1.4 (L) 09/03/2020   MG 1.7 09/01/2020   Lab Results  Component Value Date   VD25OH 22.7 (L) 09/21/2020   VD25OH 16.4 (L) 03/25/2020    No results found for: "PREALBUMIN"    Latest Ref Rng & Units 06/21/2021   11:18 AM 06/21/2021   11:17 AM 01/04/2021   11:27 AM  CBC EXTENDED  WBC 4.0 - 10.5 K/uL 3.6   3.2   RBC 3.87 - 5.11 MIL/uL 4.29  4.27  4.23    4.27   Hemoglobin 12.0 - 15.0 g/dL 12.4   12.2   HCT 36.0 - 46.0 % 36.8   36.7   Platelets 150 - 400 K/uL 262   203   NEUT# 1.7 - 7.7 K/uL 2.1   1.7   Lymph# 0.7 - 4.0 K/uL 1.2   1.2      There is no height or weight on file to calculate BMI.  Orders:  No orders of the defined types were placed in this encounter.  No orders of the defined types were placed in this encounter.    Procedures: No procedures performed  Clinical Data: No additional findings.  ROS:  All other systems negative, except as noted in the HPI. Review  of Systems  Objective: Vital Signs: LMP 12/16/2014 (Approximate)   Specialty Comments:  No specialty comments available.  PMFS History: Patient Active Problem List   Diagnosis Date Noted   HSV infection 12/13/2021   Body mass index (BMI) of 31.0 to 31.9 in adult 12/10/2021   Primary hypertension 11/29/2021   GERD without esophagitis 11/29/2021   Iron deficiency anemia 10/13/2020   Neutropenia ([place]) 09/22/2020   Malnutrition of moderate degree 09/03/2020   Acute renal failure ([place]) 09/02/2020   Gastritis and gastroduodenitis    Hypokalemia 09/01/2020   History of laparoscopic adjustable gastric banding 04/01/2020   Belching 04/01/2020   Epigastric abdominal pain 04/01/2020   Weight loss, unintentional 04/01/2020   Vitamin D deficiency 03/31/2020   Early menopause    Anemia 07/06/2018   Amenorrhea 07/05/2018   Past Medical History:  Diagnosis Date   Anemia    Class 1  obesity due to excess calories with body mass index (BMI) of 32.0 to 32.9 in adult 11/29/2021   Early menopause    Gastric polyps    Hematemesis 09/01/2020   Hypertension    Neutropenia ([place]) 09/22/2020   Recurrent vomiting 09/01/2020    Family History  Problem Relation Age of Onset   Hypertension Father    Aneurysm Father    Heart disease Maternal Grandmother    Kidney disease Maternal Grandmother    Breast cancer Other        great grandmother   Colon cancer Neg Hx    Stomach cancer Neg Hx    Pancreatic cancer Neg Hx     Past Surgical History:  Procedure Laterality Date   BIOPSY  09/02/2020   Procedure: BIOPSY;  Surgeon: Yetta Flock, MD;  Location: [facility];  Service: Gastroenterology;;   ESOPHAGOGASTRODUODENOSCOPY N/A 09/02/2020   Procedure: ESOPHAGOGASTRODUODENOSCOPY (EGD);  Surgeon: Yetta Flock, MD;  Location: [facility];  Service: Gastroenterology;  Laterality: N/A;   LAPAROSCOPIC GASTRIC BANDING     OVARIAN CYST REMOVAL     right oopherectomy     Social History   Occupational History   Not on file  Tobacco Use   Smoking status: Never   Smokeless tobacco: Never  Vaping Use   Vaping Use: Never used  Substance and Sexual Activity   Alcohol use: No   Drug use: No   Sexual activity: Not Currently

## 2023-01-19 ENCOUNTER — Encounter: Payer: Self-pay | Admitting: Nurse Practitioner

## 2023-01-19 ENCOUNTER — Ambulatory Visit (INDEPENDENT_AMBULATORY_CARE_PROVIDER_SITE_OTHER): Payer: 59 | Admitting: Nurse Practitioner

## 2023-01-19 ENCOUNTER — Other Ambulatory Visit: Payer: Self-pay | Admitting: Nurse Practitioner

## 2023-01-19 VITALS — BP 128/70 | HR 60 | Wt 238.4 lb

## 2023-01-19 DIAGNOSIS — K219 Gastro-esophageal reflux disease without esophagitis: Secondary | ICD-10-CM

## 2023-01-19 DIAGNOSIS — I10 Essential (primary) hypertension: Secondary | ICD-10-CM | POA: Diagnosis not present

## 2023-01-19 DIAGNOSIS — K2970 Gastritis, unspecified, without bleeding: Secondary | ICD-10-CM

## 2023-01-19 DIAGNOSIS — R142 Eructation: Secondary | ICD-10-CM

## 2023-01-19 DIAGNOSIS — D704 Cyclic neutropenia: Secondary | ICD-10-CM | POA: Diagnosis not present

## 2023-01-19 DIAGNOSIS — D508 Other iron deficiency anemias: Secondary | ICD-10-CM | POA: Diagnosis not present

## 2023-01-19 DIAGNOSIS — K2990 Gastroduodenitis, unspecified, without bleeding: Secondary | ICD-10-CM

## 2023-01-19 DIAGNOSIS — E28319 Asymptomatic premature menopause: Secondary | ICD-10-CM

## 2023-01-19 DIAGNOSIS — E559 Vitamin D deficiency, unspecified: Secondary | ICD-10-CM

## 2023-01-19 DIAGNOSIS — Z6831 Body mass index (BMI) 31.0-31.9, adult: Secondary | ICD-10-CM

## 2023-01-19 DIAGNOSIS — Z1231 Encounter for screening mammogram for malignant neoplasm of breast: Secondary | ICD-10-CM

## 2023-01-19 MED ORDER — PANTOPRAZOLE SODIUM 40 MG PO TBEC: 40.0000 mg | DELAYED_RELEASE_TABLET | Freq: Two times a day (BID) | ORAL | 11 refills | Status: DC

## 2023-01-19 MED ORDER — VITAMIN D3 250 MCG (10000 UT) PO CAPS: 10000.0000 [IU] | ORAL_CAPSULE | Freq: Every day | ORAL | 3 refills | Status: DC

## 2023-01-19 MED ORDER — HYDROCHLOROTHIAZIDE 25 MG PO TABS: 25.0000 mg | ORAL_TABLET | Freq: Every day | ORAL | 3 refills | Status: DC

## 2023-01-19 NOTE — Progress Notes (Addendum)
Shawna Clamp, DNP, AGNP-c [facility]  [address] [place], [place] [address] [phone]  ESTABLISHED PATIENT- Chronic Health and/or Follow-Up Visit  Blood pressure 128/70, pulse 60, weight 238 lb 6.4 oz (108.1 kg), last menstrual period 12/16/2014.    Anna Zimmerman is a 47 y.o. year old female presenting today for evaluation and management of chronic conditions.   Archer presents today with chief complaints of ongoing issues with acid reflux and gas despite taking Omeprazole . She describes a burning sensation in her chest and has tried various over-the-counter remedies without relief. The patient has a history of lap band surgery and cannot tolerate beef, as it causes vomiting. The patient reports a history of iron transfusions in 2004 due to low iron levels and has been menopausal since the age of 70. She has been seen by GI in the past. She may benefit from repeat evaluation.   She is currently taking HCTZ for blood pressure management. She denies any headaches or dizziness.   The patient also reports a history of a cyst on her ovary around the age of 50, which led to Anna Zimmerman menopause. She has not had a period for approximately 15-20 years.   She also has a history of foot pain and arthritis, as well as a fractured elbow in the past. She has not had a bone density evaluation. The patient is working on weight loss and has recently lost 5 pounds by cutting back on snacks and soda. She denies any swelling in her feet but has experienced pain in her foot, which was diagnosed as arthritis by an orthopedic specialist.  All ROS negative with exception of what is listed above.   PHYSICAL EXAM Physical Exam Vitals and nursing note reviewed.  Constitutional:      General: She is not in acute distress.    Appearance: Normal appearance.  HENT:     Head: Normocephalic.  Eyes:     Extraocular Movements: Extraocular movements intact.     Conjunctiva/sclera:  Conjunctivae normal.     Pupils: Pupils are equal, round, and reactive to light.  Neck:     Vascular: No carotid bruit.  Cardiovascular:     Rate and Rhythm: Normal rate and regular rhythm.     Pulses: Normal pulses.     Heart sounds: Normal heart sounds. No murmur heard. Pulmonary:     Effort: Pulmonary effort is normal.     Breath sounds: Normal breath sounds. No wheezing.  Abdominal:     General: Bowel sounds are normal. There is no distension.     Palpations: Abdomen is soft.     Tenderness: There is abdominal tenderness in the epigastric area. There is no right CVA tenderness, left CVA tenderness or guarding.  Musculoskeletal:        General: Tenderness present. Normal range of motion.     Cervical back: Normal range of motion and neck supple.     Right lower leg: No edema.     Left lower leg: No edema.     Comments: Ankle tenderness  Lymphadenopathy:     Cervical: No cervical adenopathy.  Skin:    General: Skin is warm and dry.     Capillary Refill: Capillary refill takes less than 2 seconds.  Neurological:     General: No focal deficit present.     Mental Status: She is alert and oriented to person, place, and time.  Psychiatric:        Mood and Affect: Mood normal.  PLAN Problem List Items Addressed This Visit     Anna Zimmerman menopause    Plan: - No specific intervention needed at this time. - Encourage her to report any concerning symptoms. - Consider DEXA scan for bone density evaluation.       Vitamin D deficiency    Plan: - given history of Anna Zimmerman menopause and fractures, we need to check labs today to ensure vitamin D is appropriate.       Relevant Medications   Cholecalciferol (VITAMIN D3) 250 MCG (10000 UT) capsule   Other Relevant Orders   Vitamin D, 25-hydroxy (Completed)   Gastritis and gastroduodenitis    Plan: - Discontinue Omeprazole . - Start Pantoprazole  twice daily for two weeks, then reduce to once daily. - Monitor for symptom  improvement and adjust dosage as needed.       Neutropenia ([facility])    Pplan: - monitor labs      Iron deficiency anemia    Plan: - Check iron levels to determine if there is a need for iron transfusions. - Continue iron supplementation as needed.       Relevant Orders   CBC with Differential (Completed)   Comprehensive metabolic panel (Completed)   Iron, TIBC and Ferritin Panel (Completed)   Primary hypertension - Primary    Plan: - Continue Hydrochlorothiazide (HCTZ) for blood pressure management. - Monitor blood pressure regularly.      Relevant Medications   hydrochlorothiazide (HYDRODIURIL) 25 MG tablet   GERD without esophagitis    Plan: - Discontinue Omeprazole . - Start Pantoprazole  twice daily for two weeks, then reduce to once daily. - Monitor for symptom improvement and adjust dosage as needed.       Body mass index (BMI) of 31.0 to 31.9 in adult    Plan: - Encourage her to continue efforts to lose weight through dietary modifications, such as reducing snack and soda intake. - Provide educational materials on weight loss and maintaining stable blood sugar levels.      Belching   Other Visit Diagnoses     Screening mammogram for breast cancer       Relevant Orders   MM 3D SCREENING MAMMOGRAM BILATERAL BREAST       Return in about 1 year (around 01/19/2024) for Med Management.   Shawna Clamp, DNP, AGNP-c 01/19/2023  3:41 PM

## 2023-01-19 NOTE — Patient Instructions (Signed)
WEIGHT LOSS PLANNING  For best management of weight, it is vital to balance intake versus output. This means the number of calories burned per day must be less than the calories you take in with food and drink.   I recommend trying to follow a diet with the following: Calories: 1200-1500 calories per day Carbohydrates: 150-180 grams of carbohydrates per day  Why: Gives your body enough "quick fuel" for cells to maintain normal function without sending them into starvation mode.  Protein: At least 90 grams of protein per day- 30 grams with each meal Why: Protein takes longer and uses more energy than carbohydrates to break down for fuel. The carbohydrates in your meals serves as quick energy sources and proteins help use some of that extra quick energy to break down to produce long term energy. This helps you not feel hungry as quickly and protein breakdown burns calories.  Water: Drink AT LEAST 64 ounces of water per day  Why: Water is essential to healthy metabolism. Water helps to fill the stomach and keep you fuller longer. Water is required for healthy digestion and filtering of waste in the body.  Fat: Limit fats in your diet- when choosing fats, choose foods with lower fats content such as lean meats (chicken, fish, turkey).  Why: Increased fat intake leads to storage "for later". Once you burn your carbohydrate energy, your body goes into fat and protein breakdown mode to help you loose weight.  Cholesterol: Fats and oils that are LIQUID at room temperature are best. Choose vegetable oils (olive oil, avocado oil, nuts). Avoid fats that are SOLID at room temperature (animal fats, processed meats). Healthy fats are often found in whole grains, beans, nuts, seeds, and berries.  Why: Elevated cholesterol levels lead to build up of cholesterol on the inside of your blood vessels. This will eventually cause the blood vessels to become hard and can lead to high blood pressure and damage to your  organs. When the blood flow is reduced, but the pressure is high from cholesterol buildup, parts of the cholesterol can break off and form clots that can go to the brain or heart leading to a stroke or heart attack.  Fiber: Increase amount of SOLUBLE the fiber in your diet. This helps to fill you up, lowers cholesterol, and helps with digestion. Some foods high in soluble fiber are oats, peas, beans, apples, carrots, barley, and citrus fruits.   Why: Fiber fills you up, helps remove excess cholesterol, and aids in healthy digestion which are all very important in weight management.   I recommend the following as a minimum activity routine: Purposeful walk or other physical activity at least 20 minutes every single day. This means purposefully taking a walk, jog, bike, swim, treadmill, elliptical, dance, etc.  This activity should be ABOVE your normal daily activities, such as walking at work. Goal exercise should be at least 150 minutes a week- work your way up to this.   Heart Rate: Your maximum exercise heart rate should be 220 - Your Age in Years. When exercising, get your heart rate up, but avoid going over the maximum targeted heart rate.  60-70% of your maximum heart rate is where you tend to burn the most fat. To find this number:  220 - Age In Years= Max HR  Max HR x 0.6 (or 0.7) = Fat Burning HR The Fat Burning HR is your goal heart rate while working out to burn the most fat.  NEVER exercise to   the point your feel lightheaded, weak, nauseated, dizzy. If you experience ANY of these symptoms- STOP exercise! Allow yourself to cool down and your heart rate to come down. Then restart slower next time.  If at ANY TIME you feel chest pain or chest pressure during exercise, STOP IMMEDIATELY and seek medical attention.   

## 2023-01-20 LAB — COMPREHENSIVE METABOLIC PANEL
ALT: 9 IU/L (ref 0–32)
AST: 12 IU/L (ref 0–40)
Albumin/Globulin Ratio: 1.5 (ref 1.2–2.2)
Albumin: 4.5 g/dL (ref 3.9–4.9)
Alkaline Phosphatase: 72 IU/L (ref 44–121)
BUN/Creatinine Ratio: 15 (ref 9–23)
BUN: 17 mg/dL (ref 6–24)
Bilirubin Total: 0.7 mg/dL (ref 0.0–1.2)
CO2: 23 mmol/L (ref 20–29)
Calcium: 9.9 mg/dL (ref 8.7–10.2)
Chloride: 99 mmol/L (ref 96–106)
Creatinine, Ser: 1.14 mg/dL — ABNORMAL HIGH (ref 0.57–1.00)
Globulin, Total: 3 g/dL (ref 1.5–4.5)
Glucose: 100 mg/dL — ABNORMAL HIGH (ref 70–99)
Potassium: 3.8 mmol/L (ref 3.5–5.2)
Sodium: 138 mmol/L (ref 134–144)
Total Protein: 7.5 g/dL (ref 6.0–8.5)
eGFR: 60 mL/min/{1.73_m2} (ref >59)

## 2023-01-20 LAB — CBC WITH DIFFERENTIAL/PLATELET
Basophils Absolute: 0 10*3/uL (ref 0.0–0.2)
Basos: 1 %
EOS (ABSOLUTE): 0 10*3/uL (ref 0.0–0.4)
Eos: 1 %
Hematocrit: 37 % (ref 34.0–46.6)
Hemoglobin: 12.2 g/dL (ref 11.1–15.9)
Immature Grans (Abs): 0 10*3/uL (ref 0.0–0.1)
Immature Granulocytes: 0 %
Lymphocytes Absolute: 2.1 10*3/uL (ref 0.7–3.1)
Lymphs: 35 %
MCH: 27.2 pg (ref 26.6–33.0)
MCHC: 33 g/dL (ref 31.5–35.7)
MCV: 82 fL (ref 79–97)
Monocytes Absolute: 0.4 10*3/uL (ref 0.1–0.9)
Monocytes: 7 %
Neutrophils Absolute: 3.3 10*3/uL (ref 1.4–7.0)
Neutrophils: 56 %
Platelets: 303 10*3/uL (ref 150–450)
RBC: 4.49 x10E6/uL (ref 3.77–5.28)
RDW: 14.2 % (ref 11.7–15.4)
WBC: 5.9 10*3/uL (ref 3.4–10.8)

## 2023-01-20 LAB — IRON,TIBC AND FERRITIN PANEL
Ferritin: 50 ng/mL (ref 15–150)
Iron Saturation: 12 % — ABNORMAL LOW (ref 15–55)
Iron: 41 ug/dL (ref 27–159)
Total Iron Binding Capacity: 329 ug/dL (ref 250–450)
UIBC: 288 ug/dL (ref 131–425)

## 2023-01-20 LAB — VITAMIN D 25 HYDROXY (VIT D DEFICIENCY, FRACTURES): Vit D, 25-Hydroxy: 36 ng/mL (ref 30.0–100.0)

## 2023-01-20 NOTE — Telephone Encounter (Signed)
Insurance does not cover it for BID alternative requested or we will have to do a PA to cover BID.

## 2023-01-23 MED ORDER — PANTOPRAZOLE SODIUM 40 MG PO TBEC: 40.0000 mg | DELAYED_RELEASE_TABLET | Freq: Every day | ORAL | 3 refills | Status: DC

## 2023-01-23 NOTE — Addendum Note (Signed)
Addended by: Herminio Commons A on: 01/23/2023 07:51 AM   Modules accepted: Orders

## 2023-01-26 ENCOUNTER — Other Ambulatory Visit: Payer: Self-pay

## 2023-01-26 ENCOUNTER — Encounter: Payer: Self-pay | Admitting: Nurse Practitioner

## 2023-01-26 MED ORDER — IRON (FERROUS SULFATE) 325 (65 FE) MG PO TABS: 325.0000 mg | ORAL_TABLET | Freq: Every day | ORAL | 5 refills | Status: AC

## 2023-01-26 NOTE — Assessment & Plan Note (Signed)
Plan: - Discontinue Omeprazole . - Start Pantoprazole  twice daily for two weeks, then reduce to once daily. - Monitor for symptom improvement and adjust dosage as needed.

## 2023-01-26 NOTE — Assessment & Plan Note (Signed)
Plan: - Check iron levels to determine if there is a need for iron transfusions. - Continue iron supplementation as needed.

## 2023-01-26 NOTE — Assessment & Plan Note (Addendum)
>>  ASSESSMENT AND PLAN FOR GERD WITHOUT ESOPHAGITIS WRITTEN ON 01/26/2023  7:49 PM BY Akito Boomhower E, NP  Plan: - Discontinue Omeprazole 20mg . - Start Pantoprazole 40mg  twice daily for two weeks, then reduce to once daily. - Monitor for symptom improvement and adjust dosage as needed.    >>ASSESSMENT AND PLAN FOR GASTRITIS AND GASTRODUODENITIS WRITTEN ON 01/26/2023  7:48 PM BY Aolani Piggott E, NP  Plan: - Discontinue Omeprazole 20mg . - Start Pantoprazole 40mg  twice daily for two weeks, then reduce to once daily. - Monitor for symptom improvement and adjust dosage as needed.

## 2023-01-26 NOTE — Assessment & Plan Note (Signed)
>>  ASSESSMENT AND PLAN FOR GERD WITHOUT ESOPHAGITIS WRITTEN ON 11/07/2023  7:07 PM BY Kalesha Irving E, NP   >>ASSESSMENT AND PLAN FOR GERD WITHOUT ESOPHAGITIS WRITTEN ON 01/26/2023  7:49 PM BY Tinna Kolker E, NP  Plan: - Discontinue Omeprazole  20mg . - Start Pantoprazole  40mg  twice daily for two weeks, then reduce to once daily. - Monitor for symptom improvement and adjust dosage as needed.    >>ASSESSMENT AND PLAN FOR GASTRITIS AND GASTRODUODENITIS WRITTEN ON 01/26/2023  7:48 PM BY Irie Fiorello E, NP  Plan: - Discontinue Omeprazole  20mg . - Start Pantoprazole  40mg  twice daily for two weeks, then reduce to once daily. - Monitor for symptom improvement and adjust dosage as needed.

## 2023-01-26 NOTE — Assessment & Plan Note (Signed)
Plan: - Continue Hydrochlorothiazide (HCTZ) for blood pressure management. - Monitor blood pressure regularly.

## 2023-01-26 NOTE — Assessment & Plan Note (Signed)
Plan: - given history of Anna Zimmerman menopause and fractures, we need to check labs today to ensure vitamin D is appropriate.

## 2023-01-26 NOTE — Assessment & Plan Note (Signed)
Pplan: - monitor labs

## 2023-01-26 NOTE — Addendum Note (Signed)
Addended by: Nikeia Henkes, Huntley Dec E on: 01/26/2023 07:53 PM   Modules accepted: Level of Service

## 2023-01-26 NOTE — Assessment & Plan Note (Signed)
Plan: - Encourage her to continue efforts to lose weight through dietary modifications, such as reducing snack and soda intake. - Provide educational materials on weight loss and maintaining stable blood sugar levels.

## 2023-01-26 NOTE — Assessment & Plan Note (Signed)
Plan: - No specific intervention needed at this time. - Encourage her to report any concerning symptoms. - Consider DEXA scan for bone density evaluation.

## 2023-03-09 ENCOUNTER — Other Ambulatory Visit: Payer: Self-pay | Admitting: Nurse Practitioner

## 2023-03-09 DIAGNOSIS — K219 Gastro-esophageal reflux disease without esophagitis: Secondary | ICD-10-CM

## 2023-07-27 ENCOUNTER — Encounter: Payer: 59 | Admitting: Nurse Practitioner

## 2023-07-28 ENCOUNTER — Encounter: Payer: 59 | Admitting: Nurse Practitioner

## 2023-08-22 ENCOUNTER — Encounter: Payer: 59 | Admitting: Nurse Practitioner

## 2023-11-06 NOTE — Progress Notes (Signed)
 Last PAP: 2021 or 2022 Vaccines:prevnar20/covid   Catheline Doing, DNP, AGNP-c [facility]  [address] [place], [place] [address] [phone]  ESTABLISHED PATIENT- Chronic Health and/or Follow-Up Visit  Blood pressure 132/82, pulse 64, weight 214 lb 6.4 oz (97.3 kg), last menstrual period 12/16/2014.    Anna Zimmerman is a 48 y.o. year old female presenting today for evaluation and management of chronic conditions.   History of Present Illness Anna Zimmerman is a 48 year old female who presents with worsening heartburn and gas.  She experiences severe heartburn and gas, described as pressure and burning under her breast, sometimes radiating to her back. The symptoms have progressively worsened, causing discomfort, particularly at night, affecting her sleep. She takes over-the-counter simethicone (Phaztyn) 500 mg capsules, which occasionally relieve the pressure, but often requires an additional dose. Despite taking pantoprazole  for acid reflux, she continues to experience significant symptoms, necessitating two doses to manage the heat buildup in her chest.  In 2021, she was hospitalized for a week around Thanksgiving due to a severe episode related to her lap band, during which the fluid was removed from the band. A scan revealed significant gas in her upper gastrointestinal tract. She has had the lap band for about 20 years, but currently has no fluid in it. She recalls a previous diagnosis of gastric polyp and H. pylori infection but does not remember the specific outcomes or treatments from that time.  She underwent a colonoscopy in 2020, during which precancerous polyps were removed. She is aware that she needs to have another colonoscopy in five years to monitor for new polyps.  She has two children, aged 9 and 30, who are also experiencing high blood pressure, prompting dietary changes in the household. She has not had a menstrual cycle in years. No recent  bleeding. She occasionally checks her blood pressure at home, noting it was high last week, but it is stable today.  All ROS negative with exception of what is listed above.   PHYSICAL EXAM Physical Exam Vitals and nursing note reviewed.  Constitutional:      General: She is not in acute distress.    Appearance: Normal appearance.  HENT:     Head: Normocephalic.  Eyes:     Conjunctiva/sclera: Conjunctivae normal.  Neck:     Vascular: No carotid bruit.  Cardiovascular:     Rate and Rhythm: Normal rate and regular rhythm.     Pulses: Normal pulses.     Heart sounds: Normal heart sounds. No murmur heard. Pulmonary:     Effort: Pulmonary effort is normal.     Breath sounds: Normal breath sounds.  Abdominal:     General: Bowel sounds are normal. There is no distension.     Palpations: Abdomen is soft.     Tenderness: There is no abdominal tenderness. There is no right CVA tenderness, left CVA tenderness, guarding or rebound.  Musculoskeletal:     Right lower leg: No edema.     Left lower leg: No edema.  Skin:    General: Skin is warm and dry.     Capillary Refill: Capillary refill takes less than 2 seconds.  Neurological:     General: No focal deficit present.     Mental Status: She is alert and oriented to person, place, and time.     Motor: No weakness.  Psychiatric:        Mood and Affect: Mood normal.      PLAN Problem List Items Addressed This  Visit     Neutropenia (HCC)   Monitor labs today.       Primary hypertension   Reports occasional high blood pressure readings at home, although today's reading was within normal limits. Has made dietary changes to manage blood pressure, influenced by her children's similar health issues. Discussed the importance of monitoring blood pressure at home and reporting consistently high readings. - Monitor blood pressure at home - Report consistently high readings      Relevant Medications   hydrochlorothiazide  (HYDRODIURIL )  25 MG tablet   pantoprazole  (PROTONIX ) 40 MG tablet   Other Relevant Orders   Hemoglobin A1c   CBC with Differential/Platelet   Comprehensive metabolic panel   GERD without esophagitis   Reports severe heartburn and gas, with symptoms including a sensation of pressure and burning under the breast and in the chest, which sometimes disrupts sleep. Currently taking over-the-counter simethicone and pantoprazole , but symptoms persist. Explained that reflux could be due to a malfunctioning esophageal sphincter, allowing acid to escape the stomach and damage the esophagus, potentially increasing acid production. Discussed increasing pantoprazole  to twice daily for 30 days to allow healing and reduce symptoms. Recommended follow-up with a GI specialist for further evaluation. - Increase pantoprazole  to twice daily for 30 days - Refer to GI specialist for further evaluation       Relevant Medications   pantoprazole  (PROTONIX ) 40 MG tablet   Iron  deficiency anemia   Relevant Medications   pantoprazole  (PROTONIX ) 40 MG tablet   Other Relevant Orders   Iron , TIBC and Ferritin Panel   CBC with Differential/Platelet   Comprehensive metabolic panel   History of laparoscopic adjustable gastric banding   Relevant Orders   Vitamin B12   Other Visit Diagnoses       Need for COVID-19 vaccine    -  Primary   Relevant Orders   Pfizer Comirnaty Covid -19 Vaccine 32yrs and older (Completed)     Gastritis and gastroduodenitis       Relevant Medications   pantoprazole  (PROTONIX ) 40 MG tablet     Belching       Relevant Medications   pantoprazole  (PROTONIX ) 40 MG tablet     Screening for colon cancer       Relevant Orders   Ambulatory referral to Gastroenterology       Return in about 6 months (around 05/06/2024) for CPE.  Catheline Doing, DNP, AGNP-c

## 2023-11-07 ENCOUNTER — Other Ambulatory Visit: Payer: Self-pay | Admitting: Nurse Practitioner

## 2023-11-07 ENCOUNTER — Encounter: Payer: Self-pay | Admitting: Nurse Practitioner

## 2023-11-07 ENCOUNTER — Ambulatory Visit (INDEPENDENT_AMBULATORY_CARE_PROVIDER_SITE_OTHER): Payer: 59 | Admitting: Nurse Practitioner

## 2023-11-07 VITALS — BP 132/82 | HR 64 | Wt 214.4 lb

## 2023-11-07 DIAGNOSIS — Z23 Encounter for immunization: Secondary | ICD-10-CM | POA: Diagnosis not present

## 2023-11-07 DIAGNOSIS — D704 Cyclic neutropenia: Secondary | ICD-10-CM

## 2023-11-07 DIAGNOSIS — K2990 Gastroduodenitis, unspecified, without bleeding: Secondary | ICD-10-CM

## 2023-11-07 DIAGNOSIS — Z1211 Encounter for screening for malignant neoplasm of colon: Secondary | ICD-10-CM

## 2023-11-07 DIAGNOSIS — D508 Other iron deficiency anemias: Secondary | ICD-10-CM

## 2023-11-07 DIAGNOSIS — R142 Eructation: Secondary | ICD-10-CM

## 2023-11-07 DIAGNOSIS — K219 Gastro-esophageal reflux disease without esophagitis: Secondary | ICD-10-CM

## 2023-11-07 DIAGNOSIS — I10 Essential (primary) hypertension: Secondary | ICD-10-CM

## 2023-11-07 DIAGNOSIS — Z9884 Bariatric surgery status: Secondary | ICD-10-CM

## 2023-11-07 DIAGNOSIS — K2970 Gastritis, unspecified, without bleeding: Secondary | ICD-10-CM

## 2023-11-07 MED ORDER — HYDROCHLOROTHIAZIDE 25 MG PO TABS: 25.0000 mg | ORAL_TABLET | Freq: Every day | ORAL | 3 refills | Status: DC

## 2023-11-07 MED ORDER — PANTOPRAZOLE SODIUM 40 MG PO TBEC: 40.0000 mg | DELAYED_RELEASE_TABLET | Freq: Two times a day (BID) | ORAL | 11 refills | Status: DC

## 2023-11-07 NOTE — Assessment & Plan Note (Signed)
 Reports severe heartburn and gas, with symptoms including a sensation of pressure and burning under the breast and in the chest, which sometimes disrupts sleep. Currently taking over-the-counter simethicone and pantoprazole , but symptoms persist. Explained that reflux could be due to a malfunctioning esophageal sphincter, allowing acid to escape the stomach and damage the esophagus, potentially increasing acid production. Discussed increasing pantoprazole  to twice daily for 30 days to allow healing and reduce symptoms. Recommended follow-up with a GI specialist for further evaluation. - Increase pantoprazole  to twice daily for 30 days - Refer to GI specialist for further evaluation

## 2023-11-07 NOTE — Patient Instructions (Signed)
 If your home blood pressure is staying high (more than 120/80), please let me know so we can adjust your medication as needed.  I have sent in the pantoprazole  twice a day. Do this for 30 days then go back to once a day to see if this help heal the intestines.   I will let you know if we need to make any other changes.   I sent the referral to the GI doctor- they will call you to schedule.

## 2023-11-07 NOTE — Assessment & Plan Note (Signed)
 Reports occasional high blood pressure readings at home, although today's reading was within normal limits. Has made dietary changes to manage blood pressure, influenced by her children's similar health issues. Discussed the importance of monitoring blood pressure at home and reporting consistently high readings. - Monitor blood pressure at home - Report consistently high readings

## 2023-11-07 NOTE — Assessment & Plan Note (Signed)
>>  ASSESSMENT AND PLAN FOR GERD WITHOUT ESOPHAGITIS WRITTEN ON 11/07/2023  7:07 PM BY Blaize Nipper E, NP  Reports severe heartburn and gas, with symptoms including a sensation of pressure and burning under the breast and in the chest, which sometimes disrupts sleep. Currently taking over-the-counter simethicone and pantoprazole , but symptoms persist. Explained that reflux could be due to a malfunctioning esophageal sphincter, allowing acid to escape the stomach and damage the esophagus, potentially increasing acid production. Discussed increasing pantoprazole  to twice daily for 30 days to allow healing and reduce symptoms. Recommended follow-up with a GI specialist for further evaluation. - Increase pantoprazole  to twice daily for 30 days - Refer to GI specialist for further evaluation

## 2023-11-07 NOTE — Assessment & Plan Note (Signed)
Monitor labs today.

## 2023-11-08 ENCOUNTER — Other Ambulatory Visit (HOSPITAL_COMMUNITY): Payer: Self-pay

## 2023-11-08 ENCOUNTER — Telehealth: Payer: Self-pay

## 2023-11-08 ENCOUNTER — Encounter: Payer: Self-pay | Admitting: Oncology

## 2023-11-08 LAB — CBC WITH DIFFERENTIAL/PLATELET
Basophils Absolute: 0 10*3/uL (ref 0.0–0.2)
Basos: 1 %
EOS (ABSOLUTE): 0 10*3/uL (ref 0.0–0.4)
Eos: 1 %
Hematocrit: 38.2 % (ref 34.0–46.6)
Hemoglobin: 12.9 g/dL (ref 11.1–15.9)
Immature Grans (Abs): 0 10*3/uL (ref 0.0–0.1)
Immature Granulocytes: 0 %
Lymphocytes Absolute: 1.7 10*3/uL (ref 0.7–3.1)
Lymphs: 31 %
MCH: 28 pg (ref 26.6–33.0)
MCHC: 33.8 g/dL (ref 31.5–35.7)
MCV: 83 fL (ref 79–97)
Monocytes Absolute: 0.4 10*3/uL (ref 0.1–0.9)
Monocytes: 6 %
Neutrophils Absolute: 3.4 10*3/uL (ref 1.4–7.0)
Neutrophils: 61 %
Platelets: 291 10*3/uL (ref 150–450)
RBC: 4.6 x10E6/uL (ref 3.77–5.28)
RDW: 13.7 % (ref 11.7–15.4)
WBC: 5.5 10*3/uL (ref 3.4–10.8)

## 2023-11-08 LAB — COMPREHENSIVE METABOLIC PANEL
ALT: 13 IU/L (ref 0–32)
AST: 14 IU/L (ref 0–40)
Albumin: 4.5 g/dL (ref 3.9–4.9)
Alkaline Phosphatase: 80 IU/L (ref 44–121)
BUN/Creatinine Ratio: 22 (ref 9–23)
BUN: 24 mg/dL (ref 6–24)
Bilirubin Total: 0.8 mg/dL (ref 0.0–1.2)
CO2: 23 mmol/L (ref 20–29)
Calcium: 9.9 mg/dL (ref 8.7–10.2)
Chloride: 99 mmol/L (ref 96–106)
Creatinine, Ser: 1.1 mg/dL — ABNORMAL HIGH (ref 0.57–1.00)
Globulin, Total: 3 g/dL (ref 1.5–4.5)
Glucose: 98 mg/dL (ref 70–99)
Potassium: 3.7 mmol/L (ref 3.5–5.2)
Sodium: 139 mmol/L (ref 134–144)
Total Protein: 7.5 g/dL (ref 6.0–8.5)
eGFR: 63 mL/min/{1.73_m2} (ref >59)

## 2023-11-08 LAB — HEMOGLOBIN A1C
Est. average glucose Bld gHb Est-mCnc: 114 mg/dL
Hgb A1c MFr Bld: 5.6 % (ref 4.8–5.6)

## 2023-11-08 LAB — IRON,TIBC AND FERRITIN PANEL
Ferritin: 65 ng/mL (ref 15–150)
Iron Saturation: 17 % (ref 15–55)
Iron: 54 ug/dL (ref 27–159)
Total Iron Binding Capacity: 313 ug/dL (ref 250–450)
UIBC: 259 ug/dL (ref 131–425)

## 2023-11-08 LAB — VITAMIN B12: Vitamin B-12: 522 pg/mL (ref 232–1245)

## 2023-11-08 NOTE — Telephone Encounter (Signed)
 Pharmacy Patient Advocate Encounter   Received notification from CoverMyMeds that prior authorization for Dexlansoprazole  60MG  dr capsules  is required/requested.   Insurance verification completed.   The patient is insured through [facility] .   Per test claim: PA required; PA submitted to above mentioned insurance via CoverMyMeds Key/confirmation #/EOC (Key: [id number])    Status is pending

## 2023-11-08 NOTE — Telephone Encounter (Signed)
 Only pay for one a day per ins.

## 2023-11-13 ENCOUNTER — Other Ambulatory Visit (HOSPITAL_COMMUNITY): Payer: Self-pay

## 2023-11-13 NOTE — Telephone Encounter (Signed)
 Pharmacy Patient Advocate Encounter  Received notification from [facility] that Prior Authorization for  Dexlansoprazole  60MG  dr capsules has been APPROVED from 2.8.25 to 2.7.26. Ran test claim, Copay is $RTS and was last filled on 11/11/23 . This test claim was processed through [facility]- copay amounts may vary at other pharmacies due to pharmacy/plan contracts, or as the patient moves through the different stages of their insurance plan.   PA #/Case ID/Reference #: Key: [id number]

## 2024-01-03 ENCOUNTER — Encounter: Payer: Self-pay | Admitting: Internal Medicine

## 2024-01-08 ENCOUNTER — Other Ambulatory Visit: Payer: Self-pay | Admitting: Nurse Practitioner

## 2024-01-08 DIAGNOSIS — D508 Other iron deficiency anemias: Secondary | ICD-10-CM

## 2024-01-08 DIAGNOSIS — K219 Gastro-esophageal reflux disease without esophagitis: Secondary | ICD-10-CM

## 2024-01-08 DIAGNOSIS — K2970 Gastritis, unspecified, without bleeding: Secondary | ICD-10-CM

## 2024-01-08 DIAGNOSIS — I10 Essential (primary) hypertension: Secondary | ICD-10-CM

## 2024-01-08 DIAGNOSIS — R142 Eructation: Secondary | ICD-10-CM

## 2024-01-08 NOTE — Telephone Encounter (Signed)
 Copied from CRM [id number]. Topic: Clinical - Medication Refill >> Jan 08, 2024  8:14 AM Turkey A wrote: Most Recent Primary Care Visit:  Provider: EARLY, SARA E  Department: [facility]  Visit Type: MED MGMT 30  Date: 11/07/2023  Medication: dexlansoprazole (DEXILANT) 60 MG capsule  Has the patient contacted their pharmacy? No (Agent: If no, request that the patient contact the pharmacy for the refill. If patient does not wish to contact the pharmacy document the reason why and proceed with request.) (Agent: If yes, when and what did the pharmacy advise?)  Is this the correct pharmacy for this prescription? Yes If no, delete pharmacy and type the correct one.  This is the patient's preferred pharmacy:  [id number] [place], [place] - [address] [place] [address] Phone: [phone] Fax: [phone]   Has the prescription been filled recently? No  Is the patient out of the medication? No 5 days left  Has the patient been seen for an appointment in the last year OR does the patient have an upcoming appointment? Yes  Can we respond through MyChart? No  Agent: Please be advised that Rx refills may take up to 3 business days. We ask that you follow-up with your pharmacy.

## 2024-01-09 ENCOUNTER — Other Ambulatory Visit: Payer: Self-pay | Admitting: Nurse Practitioner

## 2024-01-09 DIAGNOSIS — R142 Eructation: Secondary | ICD-10-CM

## 2024-01-09 DIAGNOSIS — K219 Gastro-esophageal reflux disease without esophagitis: Secondary | ICD-10-CM

## 2024-01-09 DIAGNOSIS — D508 Other iron deficiency anemias: Secondary | ICD-10-CM

## 2024-01-09 DIAGNOSIS — I10 Essential (primary) hypertension: Secondary | ICD-10-CM

## 2024-01-09 DIAGNOSIS — K2970 Gastritis, unspecified, without bleeding: Secondary | ICD-10-CM

## 2024-01-10 ENCOUNTER — Telehealth: Payer: Self-pay | Admitting: Nurse Practitioner

## 2024-01-10 DIAGNOSIS — K219 Gastro-esophageal reflux disease without esophagitis: Secondary | ICD-10-CM

## 2024-01-10 DIAGNOSIS — R142 Eructation: Secondary | ICD-10-CM

## 2024-01-10 DIAGNOSIS — D508 Other iron deficiency anemias: Secondary | ICD-10-CM

## 2024-01-10 DIAGNOSIS — K2970 Gastritis, unspecified, without bleeding: Secondary | ICD-10-CM

## 2024-01-10 DIAGNOSIS — I10 Essential (primary) hypertension: Secondary | ICD-10-CM

## 2024-01-10 MED ORDER — DEXLANSOPRAZOLE 60 MG PO CPDR: 60.0000 mg | DELAYED_RELEASE_CAPSULE | Freq: Every day | ORAL | 1 refills | Status: DC

## 2024-01-10 NOTE — Telephone Encounter (Signed)
 Changed script and resent

## 2024-01-10 NOTE — Telephone Encounter (Signed)
 Patient called and asked if she's referring to dexlansoprazole? She says yes. Advised it was denied because based on the instructions it's noted to take dexlansoprazole x 8 weeks then go back on pantoprazole 40 mg. She says it helps her reflux much better than the pantoprazole. Advised I will send this to her provider to decide if she will refill dexlansoprazole or pantoprazole. She asked when would she know because she says she has 2 pills left of dexlansoprazole and has some pantoprazole as well. Advised I'm not sure, it could take up to 3 days. She verbalized understanding and asks for the refill to be sent to [facility] preferred [facility] [address] [facility].   Copied from CRM [id number]. Topic: Clinical - Medication Question >> Jan 10, 2024  9:38 AM Marland Kitchen D wrote: Patient wants to know why her prescription was denied please call her back

## 2024-05-07 ENCOUNTER — Other Ambulatory Visit: Payer: Self-pay | Admitting: Nurse Practitioner

## 2024-05-07 DIAGNOSIS — K2970 Gastritis, unspecified, without bleeding: Secondary | ICD-10-CM

## 2024-05-07 DIAGNOSIS — R142 Eructation: Secondary | ICD-10-CM

## 2024-05-07 DIAGNOSIS — D508 Other iron deficiency anemias: Secondary | ICD-10-CM

## 2024-05-07 DIAGNOSIS — K219 Gastro-esophageal reflux disease without esophagitis: Secondary | ICD-10-CM

## 2024-05-07 DIAGNOSIS — I10 Essential (primary) hypertension: Secondary | ICD-10-CM

## 2024-05-07 MED ORDER — DEXLANSOPRAZOLE 60 MG PO CPDR: 60.0000 mg | DELAYED_RELEASE_CAPSULE | Freq: Every day | ORAL | 1 refills | Status: DC

## 2024-05-07 NOTE — Telephone Encounter (Signed)
 Copied from CRM #[id number]. Topic: Clinical - Medication Refill >> May 07, 2024  8:21 AM Gennette ORN wrote: Medication: dexlansoprazole  (DEXILANT ) 60 MG capsule  Has the patient contacted their pharmacy? No (Agent: If no, request that the patient contact the pharmacy for the refill. If patient does not wish to contact the pharmacy document the reason why and proceed with request.) (Agent: If yes, when and what did the pharmacy advise?)  This is the patient's preferred pharmacy:  [id number] [place], [place] - [address] [place] [address] Phone: [phone] Fax: [phone]  Is this the correct pharmacy for this prescription? Yes If no, delete pharmacy and type the correct one.   Has the prescription been filled recently? Yes  Is the patient out of the medication? No   Has the patient been seen for an appointment in the last year OR does the patient have an upcoming appointment? Yes  Can we respond through MyChart? No  Agent: Please be advised that Rx refills may take up to 3 business days. We ask that you follow-up with your pharmacy.

## 2024-05-17 ENCOUNTER — Encounter: Payer: 59 | Admitting: Nurse Practitioner

## 2024-05-30 ENCOUNTER — Ambulatory Visit: Admitting: Nurse Practitioner

## 2024-05-30 ENCOUNTER — Encounter: Payer: Self-pay | Admitting: Nurse Practitioner

## 2024-05-30 VITALS — BP 124/78 | HR 68 | Temp 98.5°F | Wt 216.4 lb

## 2024-05-30 DIAGNOSIS — R059 Cough, unspecified: Secondary | ICD-10-CM | POA: Diagnosis not present

## 2024-05-30 DIAGNOSIS — R519 Headache, unspecified: Secondary | ICD-10-CM

## 2024-05-30 DIAGNOSIS — J029 Acute pharyngitis, unspecified: Secondary | ICD-10-CM

## 2024-05-30 DIAGNOSIS — H66001 Acute suppurative otitis media without spontaneous rupture of ear drum, right ear: Secondary | ICD-10-CM

## 2024-05-30 DIAGNOSIS — I10 Essential (primary) hypertension: Secondary | ICD-10-CM | POA: Diagnosis not present

## 2024-05-30 DIAGNOSIS — H6120 Impacted cerumen, unspecified ear: Secondary | ICD-10-CM | POA: Diagnosis not present

## 2024-05-30 LAB — POCT RAPID STREP A (OFFICE): Rapid Strep A Screen: NEGATIVE

## 2024-05-30 LAB — POCT INFLUENZA A/B
Influenza A, POC: NEGATIVE
Influenza B, POC: NEGATIVE

## 2024-05-30 LAB — POC COVID19 BINAXNOW: SARS Coronavirus 2 Ag: NEGATIVE

## 2024-05-30 MED ORDER — HYDROCHLOROTHIAZIDE 25 MG PO TABS: 25.0000 mg | ORAL_TABLET | Freq: Every day | ORAL | 3 refills | Status: AC

## 2024-05-30 MED ORDER — CEPHALEXIN 500 MG PO CAPS: 500.0000 mg | ORAL_CAPSULE | Freq: Two times a day (BID) | ORAL | 0 refills | Status: AC

## 2024-05-30 NOTE — Patient Instructions (Signed)

## 2024-05-30 NOTE — Progress Notes (Signed)
 Camie FORBES Doing, DNP, AGNP-c [facility] [address] [place], [place] [address] [phone]   ACUTE VISIT on 05/30/2024  Blood pressure 124/78, pulse 68, temperature 98.5 F (36.9 C), weight 216 lb 6.4 oz (98.2 kg), last menstrual period 12/16/2014.  Subjective:  HPI  History of Present Illness Anna Zimmerman is a 48 year old female who presents with severe ear pain, sore throat, and headache.  She has been experiencing severe ear pain, sore throat, and headache for approximately a week and a half. The ear pain is throbbing and intense, not relieved by Tylenol  as it usually is. The pain is severe enough to cause headaches and a burning sensation in her throat, making swallowing painful. No fever or chest issues. The pain is particularly bad at night, leading her to take more Tylenol  than usual.  She has a history of Bell's palsy, which she experienced two to three times about 19 years ago. During those episodes, the nerves in her ear were affected, causing pain, but she notes that this current pain feels different and more intense. She has not had an ear infection in years and usually associates ear pain with nerve issues. However, this pain feels different, prompting her to seek medical attention.  She has been using peroxide in her ear, which bubbles but does not seem to alleviate the symptoms.   ROS negative except for what is listed in HPI. History, Medications, Surgery, SDOH, and Family History reviewed and updated as appropriate.  Objective:  Physical Exam Vitals and nursing note reviewed.  Constitutional:      General: She is not in acute distress.    Appearance: Normal appearance. She is not toxic-appearing.  HENT:     Head: Normocephalic.     Ears:     Comments: Cerumen impaction cleared bilaterally. Right TM erythematous and bulging with purulent effusion present. Left TM shows clear effusion.     Nose: Congestion present.     Mouth/Throat:      Mouth: Mucous membranes are moist.     Pharynx: Oropharynx is clear.  Eyes:     Pupils: Pupils are equal, round, and reactive to light.  Cardiovascular:     Rate and Rhythm: Normal rate and regular rhythm.     Pulses: Normal pulses.     Heart sounds: Normal heart sounds.  Pulmonary:     Effort: Pulmonary effort is normal.     Breath sounds: Normal breath sounds.  Lymphadenopathy:     Cervical: Cervical adenopathy present.  Skin:    General: Skin is warm and dry.     Capillary Refill: Capillary refill takes less than 2 seconds.  Neurological:     General: No focal deficit present.     Mental Status: She is alert and oriented to person, place, and time.         Assessment & Plan:   Problem List Items Addressed This Visit     Primary hypertension   Hypertension is well-controlled with current medication regimen. No issues reported with blood pressure during this visit. - Refill hydrochlorothiazide  prescription to ensure continuity of care until next visit in October.      Relevant Medications   hydrochlorothiazide  (HYDRODIURIL ) 25 MG tablet   Non-recurrent acute suppurative otitis media of right ear without spontaneous rupture of tympanic membrane - Primary   Right otitis externa with significant cerumen impaction causing ear pain, sore throat, and headache. Symptoms have been present for about a week and a half. The ear canal  was blocked with wax, which was removed, revealing an infection. The sore throat is likely due to drainage from the ear infection. No fever or chest issues reported. Differential diagnosis included sinus and ear infections due to recent environmental changes. - Administer peroxide to loosen ear wax and facilitate removal. - Prescribe Keflex  500 mg twice daily for 7 days for ear infection. If symptoms improve significantly by day 5, she may stop the antibiotic. - Instruct to use peroxide in the ear for 5 minutes nightly for a week to help clear remaining  wax. - Advise to take Keflex  with food to prevent stomach upset. - Discuss potential for yogurt to prevent yeast infections while on antibiotics.      Relevant Medications   cephALEXin  (KEFLEX ) 500 MG capsule   Other Visit Diagnoses       Sore throat       Relevant Orders   Rapid Strep A (Completed)     Cough, unspecified type       Relevant Orders   Influenza A/B (Completed)   POC COVID-19 (Completed)     Nonintractable headache, unspecified chronicity pattern, unspecified headache type          Camie FORBES Doing, DNP, AGNP-c

## 2024-06-02 ENCOUNTER — Encounter: Payer: Self-pay | Admitting: Nurse Practitioner

## 2024-06-02 DIAGNOSIS — H66001 Acute suppurative otitis media without spontaneous rupture of ear drum, right ear: Secondary | ICD-10-CM | POA: Insufficient documentation

## 2024-06-02 NOTE — Assessment & Plan Note (Signed)
 Right otitis externa with significant cerumen impaction causing ear pain, sore throat, and headache. Symptoms have been present for about a week and a half. The ear canal was blocked with wax, which was removed, revealing an infection. The sore throat is likely due to drainage from the ear infection. No fever or chest issues reported. Differential diagnosis included sinus and ear infections due to recent environmental changes. - Administer peroxide to loosen ear wax and facilitate removal. - Prescribe Keflex  500 mg twice daily for 7 days for ear infection. If symptoms improve significantly by day 5, she may stop the antibiotic. - Instruct to use peroxide in the ear for 5 minutes nightly for a week to help clear remaining wax. - Advise to take Keflex  with food to prevent stomach upset. - Discuss potential for yogurt to prevent yeast infections while on antibiotics.

## 2024-06-02 NOTE — Assessment & Plan Note (Signed)
 Hypertension is well-controlled with current medication regimen. No issues reported with blood pressure during this visit. - Refill hydrochlorothiazide  prescription to ensure continuity of care until next visit in October.

## 2024-06-19 ENCOUNTER — Ambulatory Visit (INDEPENDENT_AMBULATORY_CARE_PROVIDER_SITE_OTHER): Admitting: Family Medicine

## 2024-06-19 ENCOUNTER — Ambulatory Visit: Payer: Self-pay

## 2024-06-19 ENCOUNTER — Encounter: Payer: Self-pay | Admitting: Family Medicine

## 2024-06-19 VITALS — BP 134/80 | HR 72 | Temp 97.8°F | Ht 69.0 in | Wt 215.4 lb

## 2024-06-19 DIAGNOSIS — H9201 Otalgia, right ear: Secondary | ICD-10-CM

## 2024-06-19 DIAGNOSIS — M2669 Other specified disorders of temporomandibular joint: Secondary | ICD-10-CM

## 2024-06-19 DIAGNOSIS — M26621 Arthralgia of right temporomandibular joint: Secondary | ICD-10-CM

## 2024-06-19 MED ORDER — MELOXICAM 15 MG PO TABS: 15.0000 mg | ORAL_TABLET | Freq: Every day | ORAL | 0 refills | Status: DC

## 2024-06-19 MED ORDER — CEFDINIR 300 MG PO CAPS: 300.0000 mg | ORAL_CAPSULE | Freq: Two times a day (BID) | ORAL | 0 refills | Status: DC

## 2024-06-19 NOTE — Patient Instructions (Signed)
 RIGHT TEMPOROMANDIBULAR JOINT (TMJ) DISORDER: You have pain in your right jaw that is worse at night.  -You were prescribed anti-inflammatory medication to help manage the pain and reduce inflammation. -Please use the anti-inflammatory medication regularly as directed. Take it once daily with food, until your pain has resolved.  Do not take any over-the-counter advil /motrin jeronimo, etc.  You may use Tylenol  along with the prescription meloxicam , if needed for breakthrough pain.  RIGHT EAR PAIN: Your infection appears to be resolving.  There is some residual effusion (fluid), but no redness or suggestion of an acute infection. -You were given a prescription for antibiotics to use if you develop a fever, swollen glands, or if the ear pain worsens.  Take this for up to 10 days.  You can stop at 7 if your ear pain completely resolves in 3-4 days.  HYPERTENSION: Your blood pressure is hight than normal today, likely due to the pain you are experiencing, possibly from the ibuprofen . -Continue taking your current blood pressure medication, HCTZ. -Please monitor your blood pressure at home regularly.

## 2024-06-19 NOTE — Telephone Encounter (Signed)
 FYI Only or Action Required?: FYI only for provider.  Patient was last seen in primary care on 05/30/2024 by Early, Camie BRAVO, NP.  Called Nurse Triage reporting Otalgia.  Symptoms began several weeks ago.  Interventions attempted: Nothing.  Symptoms are: gradually worsening.  Triage Disposition: See HCP Within 4 Hours (Or PCP Triage)  Patient/caregiver understands and will follow disposition?: Yes      Copied from CRM [id number]. Topic: Clinical - Red Word Triage >> Jun 19, 2024  8:02 AM Charlet HERO wrote: Red Word that prompted transfer to Nurse Triage: Patient is calling about severe pain in her right ear tylenol  is not working and is getting worst. Reason for Disposition  [1] SEVERE pain (e.g., excruciating) and [2] not improved 2 hours after pain medicine (e.g., acetaminophen  or ibuprofen )  Answer Assessment - Initial Assessment Questions 1. LOCATION: Which ear is involved?     Right ear 2. ONSET: When did the ear pain start?      2 weeks ago 3. SEVERITY: How bad is the pain?  (Scale 1-10; mild, moderate or severe)     Severe, at night its worse, throbbing 4. URI SYMPTOMS: Do you have a runny nose or cough?     denies 5. FEVER: Do you have a fever? If Yes, ask: What is your temperature, how was it measured, and when did it start?     denies 6. CAUSE: Have you been swimming recently?, How often do you use Q-TIPS?, Have you had any recent air travel or scuba diving?     denies 7. OTHER SYMPTOMS: Do you have any other symptoms? (e.g., decreased hearing, dizziness, headache, stiff neck, vomiting)     denies 8. PREGNANCY: Is there any chance you are pregnant? When was your last menstrual period?     na  Protocols used: [place]

## 2024-06-19 NOTE — Progress Notes (Signed)
 Chief Complaint  Patient presents with   Ear Pain    Saw SaraBeth 2 weeks ago for ear infection, got better but is back. Taking motrin  x 2 days.    Discussed the use of AI scribe software for clinical note transcription with the patient, who gave verbal consent to proceed.   Anna Zimmerman is a 48 year old female who presents with severe right ear pain.  She has experienced severe right ear pain for several weeks, worsening over time, described as throbbing and more intense at night. Initially seen on August 28th by PCP, both ears were cleaned of wax, and she was prescribed Keflex , which provided temporary relief. It took about the full week of antibiotics before the pain completely resolved. The pain returned a week after completing the antibiotic course.  The pain is worse at night, and now radiates to her right lower jaw but does not worsen with chewing. There are no associated symptoms such as sore throat, fever, headache, or respiratory issues.   She manages the pain with 600 mg of Motrin  as needed, which provides some relief, but she rates the pain as 7 out of 10 after taking it this morning. She took 3 this morning, 2 yesterday, and 3 the night before (of 200 mg ibuprofen ).  H/o Bells palsy about 5 years ago, multiple episodes with ear pain and was told it was related to the nerves.  This mainly occurs in the winter.  Reported at last visit that current ear pain felt different than the nerve pain.  Exam at recent visit  was c/w R OM, as well as cerumen impaction.    PMH, PSH, SH reviewed  Outpatient Encounter Medications as of 06/19/2024  Medication Sig Note   acetaminophen  (TYLENOL ) 500 MG tablet Take 1,000 mg by mouth every 8 (eight) hours as needed for mild pain or headache. 06/19/2024: Took 3 on Monday   dexlansoprazole  (DEXILANT ) 60 MG capsule Take 1 capsule (60 mg total) by mouth daily.    hydrochlorothiazide  (HYDRODIURIL ) 25 MG tablet Take 1 tablet (25 mg total) by mouth daily.     ibuprofen  (ADVIL ) 200 MG tablet Take 200 mg by mouth every 6 (six) hours as needed. 06/19/2024: Took 3 at 1am   Iron , Ferrous Sulfate , 325 (65 Fe) MG TABS Take 325 mg by mouth daily.    No facility-administered encounter medications on file as of 06/19/2024.   Allergies  Allergen Reactions   Azithromycin     rash   Penicillins     Rash Has patient had a PCN reaction causing immediate rash, facial/tongue/throat swelling, SOB or lightheadedness with hypotension: YES Has patient had a PCN reaction causing severe rash involving mucus membranes or skin necrosis:NO Has patient had a PCN reaction that required hospitalizationNO Has patient had a PCN reaction occurring within the last 10 years: NO If all of the above answers are NO, then may proceed with Cephalosporin use.    ROS:  No f/c, no ST, jaw pain, HA, nasal congestion, cough , SOB, CP No bleeding, bruising, rash, vaginal discharge or itch. No n/v/d    PHYSICAL EXAM:  BP 134/80   Pulse 72   Temp 97.8 F (36.6 C) (Tympanic)   Ht 5' 9 (1.753 m)   Wt 215 lb 6.4 oz (97.7 kg)   LMP 12/16/2014 (Approximate)   BMI 31.81 kg/m   Pleasant, well-appearing female in no acute distress HEENT: PERRL, EOMI, conjunctiva and sclera are clear.  R TM without erythema, + effusion  present, more diffuse light reflex.  No purulence.  L ear has significant cerumen located inferiorly. Upper portion of canal is free of cerumen. The upper portion of the TM is without erythema, but exam of TM is limited due to cerumen. Nasal mucosa shows only mild edema, L>R. Sinuses are nontender. OP is clear +tender to palpation at R TMJ, with crepitus L>R. Neck: no lymphadenopathy or mass Heart: regular rate and rhythm Lungs: clear bilaterally Skin: normal turgor, no rash Psych: normal mood, affect, hygiene and grooming Neuro: alert and oriented, cranial nerves grossly intact. Normal gait.    ASSESSMENT/PLAN:  Otalgia of right ear - s/p rx for OM,  w/residual effusion, but no infection. Current severe pain is likely related to TMJ issues. ABX only if fever or worsening ear pain despite NSAID - Plan: cefdinir  (OMNICEF ) 300 MG capsule  TMJ tenderness, right - NSAID risks reviewed. Meloxicam  in place of OTC ibuprofen . Avoid gum chewing. Tylenol  prn. - Plan: meloxicam  (MOBIC ) 15 MG tablet  Crepitus of right TMJ on opening of jaw - Plan: meloxicam  (MOBIC ) 15 MG tablet   Right temporomandibular joint disorder (TMJ) Right TMJ disorder with pain radiating to the jaw, exacerbated at night. Examination reveals crepitus and clicking on the right side, suggesting TMJ involvement rather than ear infection. Pain management prioritized over antibiotics due to lack of infection signs. - Prescribed anti-inflammatory medication for TMJ pain management. - Advised regular use of anti-inflammatory medication to reduce joint inflammation.  NSAID precautions reviewed  Right ear pain Chronic right ear pain with previous infection treated with Keflex . Current examination shows no redness or bulging, but fluid is present behind the ear. Pain may be referred from TMJ rather than an active ear infection. No fever, chills, or other signs of systemic infection. Antibiotics reserved for signs of infection due to potential resistance and side effects. - Provided a prescription for antibiotics (Cefdinir ) to be used if fever, swollen glands, or worsening pain occurs.  Hypertension Hypertension managed with HCTZ.  Discussed her concern of NSAIDs raising blood pressure, discussing that pain can also adversely effect BP. - Continue current antihypertensive regimen with HCTZ. - Monitor blood pressure at home regularly.  Declines flu shot   I spent 39 minutes dedicated to the care of this patient, including pre-visit review of records, face to face time, post-visit ordering of testing and documentation.

## 2024-07-04 ENCOUNTER — Other Ambulatory Visit: Payer: Self-pay | Admitting: Family Medicine

## 2024-07-04 DIAGNOSIS — M2669 Other specified disorders of temporomandibular joint: Secondary | ICD-10-CM

## 2024-07-04 DIAGNOSIS — M26621 Arthralgia of right temporomandibular joint: Secondary | ICD-10-CM

## 2024-07-15 NOTE — Progress Notes (Signed)
 Mammo: Pap: Colonoscopy:   Catheline Doing, DNP, AGNP-c [facility] [address] [place], [place] [address] Main Office [phone] VISIT TYPE: CPE on 07/16/2024 Today's Vitals   07/16/24 1333  BP: 130/82  Pulse: 84  Weight: 209 lb 12.8 oz (95.2 kg)  Height: 5' 9.5 (1.765 m)   Body mass index is 30.54 kg/m. BP 130/82   Pulse 84   Ht 5' 9.5 (1.765 m)   Wt 209 lb 12.8 oz (95.2 kg)   LMP 12/16/2014 (Approximate)   BMI 30.54 kg/m   Subjective:    Patient ID: Anna Zimmerman, female    DOB: Sep 07, 1976, 48 y.o.   MRN: [id number]  HPI: History of Present Illness Anna Zimmerman is a 48 year old female who presents for her annual exam.  She experiences difficulty with her current medication regimen for acid reflux. She is taking Dexilant  60 mg daily, which is effective during the day but not at night when she lies down. She experiences a sensation of acid sitting in her throat at night, which is bothersome.  She was diagnosed with TMJ after experiencing ear pain and jaw clicking, initially thought to be an ear infection. She reports persistent pain that feels like it's in the inner ear, and the jaw clicks when she opens her mouth. She was previously prescribed meloxicam , which provided some relief. She avoids ibuprofen  due to its potential to worsen her acid reflux and prefers Tylenol  for pain management.  She was diagnosed with genital herpes two years ago and manages outbreaks with Valacyclovir  500 mg as needed. She requests a refill for this medication, noting that she takes it only during outbreaks, which are not frequent. She identifies stress, sugar, and lack of rest as triggers for her outbreaks.  She takes iron  supplements daily and has no concerns with hearing or vision. No swelling in her feet or ankles. She has a history of high blood pressure, which was notably high during a previous colonoscopy, causing her anxiety about undergoing procedures. She  had a polyp removed during a colonoscopy five years ago.  She has a history of shingles, first experienced in her Edwardo Wojnarowski twenties. She notes that her body does not handle stress well, which has contributed to her previous health issues.  Pertinent items are noted in HPI.  Most Recent Depression Screen:     07/16/2024    1:30 PM 11/07/2023    3:49 PM 01/19/2023    3:44 PM 12/10/2021    1:32 PM 11/29/2021    9:12 AM  Depression screen PHQ 2/9  Decreased Interest 0 0 0 0 0  Down, Depressed, Hopeless 0 0 0 0 0  PHQ - 2 Score 0 0 0 0 0   Most Recent Anxiety Screen:      No data to display         Most Recent Fall Screen:    07/16/2024    1:30 PM 11/07/2023    3:49 PM 01/19/2023    3:42 PM 12/10/2021    1:32 PM 11/29/2021    9:11 AM  Fall Risk   Falls in the past year? 0 0 0 1 1  Number falls in past yr: 0 0 0 0 1  Injury with Fall? 0 0 0 1 1  Comment     Jan 22-fx left elbow, march 22 fx right pinky toe  Risk for fall due to : No Fall Risks No Fall Risks No Fall Risks No Fall Risks History of fall(s)  Follow  up Falls evaluation completed Falls evaluation completed Falls evaluation completed Education provided;Falls evaluation completed  Falls evaluation completed      Data saved with a previous flowsheet row definition    Past medical history, surgical history, medications, allergies, family history and social history reviewed with patient today and changes made to appropriate areas of the chart.  Past Medical History:  Past Medical History:  Diagnosis Date   Acute renal failure 09/02/2020   Amenorrhea 07/05/2018   Anemia    Class 1 obesity due to excess calories with body mass index (BMI) of 32.0 to 32.9 in adult 11/29/2021   Cloe Sockwell menopause    Gastric polyps    Hematemesis 09/01/2020   Hypertension    Hypokalemia 09/01/2020   Malnutrition of moderate degree 09/03/2020   Neutropenia 09/22/2020   Recurrent vomiting 09/01/2020   Weight loss, unintentional 04/01/2020    Medications:  Current Outpatient Medications on File Prior to Visit  Medication Sig   acetaminophen  (TYLENOL ) 500 MG tablet Take 1,000 mg by mouth every 8 (eight) hours as needed for mild pain or headache.   hydrochlorothiazide  (HYDRODIURIL ) 25 MG tablet Take 1 tablet (25 mg total) by mouth daily.   ibuprofen  (ADVIL ) 200 MG tablet Take 200 mg by mouth every 6 (six) hours as needed.   Iron , Ferrous Sulfate , 325 (65 Fe) MG TABS Take 325 mg by mouth daily.   No current facility-administered medications on file prior to visit.   Surgical History:  Past Surgical History:  Procedure Laterality Date   BIOPSY  09/02/2020   Procedure: BIOPSY;  Surgeon: Leigh Elspeth SQUIBB, MD;  Location: [facility];  Service: Gastroenterology;;   ESOPHAGOGASTRODUODENOSCOPY N/A 09/02/2020   Procedure: ESOPHAGOGASTRODUODENOSCOPY (EGD);  Surgeon: Leigh Elspeth SQUIBB, MD;  Location: [facility];  Service: Gastroenterology;  Laterality: N/A;   LAPAROSCOPIC GASTRIC BANDING     OVARIAN CYST REMOVAL     right oopherectomy     Allergies:  Allergies  Allergen Reactions   Azithromycin     rash   Penicillins     Rash Has patient had a PCN reaction causing immediate rash, facial/tongue/throat swelling, SOB or lightheadedness with hypotension: YES Has patient had a PCN reaction causing severe rash involving mucus membranes or skin necrosis:NO Has patient had a PCN reaction that required hospitalizationNO Has patient had a PCN reaction occurring within the last 10 years: NO If all of the above answers are NO, then may proceed with Cephalosporin use.   Family History:  Family History  Problem Relation Age of Onset   Hypertension Father    Aneurysm Father    Heart disease Maternal Grandmother    Kidney disease Maternal Grandmother    Breast cancer Other        great grandmother   Colon cancer Neg Hx    Stomach cancer Neg Hx    Pancreatic cancer Neg Hx        Objective:    BP 130/82   Pulse 84   Ht  5' 9.5 (1.765 m)   Wt 209 lb 12.8 oz (95.2 kg)   LMP 12/16/2014 (Approximate)   BMI 30.54 kg/m   Wt Readings from Last 3 Encounters:  07/16/24 209 lb 12.8 oz (95.2 kg)  06/19/24 215 lb 6.4 oz (97.7 kg)  05/30/24 216 lb 6.4 oz (98.2 kg)    Physical Exam Vitals and nursing note reviewed.  Constitutional:      General: She is not in acute distress.    Appearance: Normal appearance.  HENT:  Head: Normocephalic and atraumatic.     Right Ear: Hearing, tympanic membrane, ear canal and external ear normal.     Left Ear: Hearing, tympanic membrane, ear canal and external ear normal.     Nose: Nose normal.     Right Sinus: No maxillary sinus tenderness or frontal sinus tenderness.     Left Sinus: No maxillary sinus tenderness or frontal sinus tenderness.     Mouth/Throat:     Lips: Pink.     Mouth: Mucous membranes are moist.     Pharynx: Oropharynx is clear.     Comments: Right sided jaw and ear tenderness present with popping of the TMJ noted.  Eyes:     General: Lids are normal. Vision grossly intact.     Extraocular Movements: Extraocular movements intact.     Conjunctiva/sclera: Conjunctivae normal.     Pupils: Pupils are equal, round, and reactive to light.     Funduscopic exam:    Right eye: Red reflex present.        Left eye: Red reflex present.    Visual Fields: Right eye visual fields normal and left eye visual fields normal.  Neck:     Thyroid : No thyromegaly.     Vascular: No carotid bruit.  Cardiovascular:     Rate and Rhythm: Normal rate and regular rhythm.     Chest Wall: PMI is not displaced.     Pulses: Normal pulses.          Dorsalis pedis pulses are 2+ on the right side and 2+ on the left side.       Posterior tibial pulses are 2+ on the right side and 2+ on the left side.     Heart sounds: Normal heart sounds. No murmur heard. Pulmonary:     Effort: Pulmonary effort is normal. No respiratory distress.     Breath sounds: Normal breath sounds.   Abdominal:     General: Abdomen is flat. Bowel sounds are normal. There is no distension.     Palpations: Abdomen is soft. There is no hepatomegaly, splenomegaly or mass.     Tenderness: There is abdominal tenderness in the epigastric area. There is no right CVA tenderness, left CVA tenderness, guarding or rebound.  Musculoskeletal:        General: Normal range of motion.     Cervical back: Full passive range of motion without pain, normal range of motion and neck supple. No tenderness.     Right lower leg: No edema.     Left lower leg: No edema.  Feet:     Left foot:     Toenail Condition: Left toenails are normal.  Lymphadenopathy:     Cervical: No cervical adenopathy.     Upper Body:     Right upper body: No supraclavicular adenopathy.     Left upper body: No supraclavicular adenopathy.  Skin:    General: Skin is warm and dry.     Capillary Refill: Capillary refill takes less than 2 seconds.     Nails: There is no clubbing.  Neurological:     General: No focal deficit present.     Mental Status: She is alert and oriented to person, place, and time.     GCS: GCS eye subscore is 4. GCS verbal subscore is 5. GCS motor subscore is 6.     Sensory: Sensation is intact.     Motor: Motor function is intact.     Coordination: Coordination is intact.  Gait: Gait is intact.     Deep Tendon Reflexes: Reflexes are normal and symmetric.  Psychiatric:        Attention and Perception: Attention normal.        Mood and Affect: Mood normal.        Speech: Speech normal.        Behavior: Behavior normal. Behavior is cooperative.        Thought Content: Thought content normal.        Cognition and Memory: Cognition and memory normal.        Judgment: Judgment normal.      Results for orders placed or performed in visit on 07/16/24  CBC with Differential/Platelet   Collection Time: 07/16/24  2:30 PM  Result Value Ref Range   WBC 5.0 3.4 - 10.8 x10E3/uL   RBC 4.54 3.77 - 5.28  x10E6/uL   Hemoglobin 12.9 11.1 - 15.9 g/dL   Hematocrit 60.3 65.9 - 46.6 %   MCV 87 79 - 97 fL   MCH 28.4 26.6 - 33.0 pg   MCHC 32.6 31.5 - 35.7 g/dL   RDW 86.2 88.2 - 84.5 %   Platelets 363 150 - 450 x10E3/uL   Neutrophils 57 Not Estab. %   Lymphs 35 Not Estab. %   Monocytes 7 Not Estab. %   Eos 0 Not Estab. %   Basos 1 Not Estab. %   Neutrophils Absolute 2.9 1.4 - 7.0 x10E3/uL   Lymphocytes Absolute 1.7 0.7 - 3.1 x10E3/uL   Monocytes Absolute 0.3 0.1 - 0.9 x10E3/uL   EOS (ABSOLUTE) 0.0 0.0 - 0.4 x10E3/uL   Basophils Absolute 0.0 0.0 - 0.2 x10E3/uL   Immature Granulocytes 0 Not Estab. %   Immature Grans (Abs) 0.0 0.0 - 0.1 x10E3/uL  CMP14+EGFR   Collection Time: 07/16/24  2:30 PM  Result Value Ref Range   Glucose 103 (H) 70 - 99 mg/dL   BUN 27 (H) 6 - 24 mg/dL   Creatinine, Ser 8.71 (H) 0.57 - 1.00 mg/dL   eGFR 52 (L) >40 fO/fpw/8.26   BUN/Creatinine Ratio 21 9 - 23   Sodium 140 134 - 144 mmol/L   Potassium 4.1 3.5 - 5.2 mmol/L   Chloride 101 96 - 106 mmol/L   CO2 24 20 - 29 mmol/L   Calcium 10.1 8.7 - 10.2 mg/dL   Total Protein 7.8 6.0 - 8.5 g/dL   Albumin 4.6 3.9 - 4.9 g/dL   Globulin, Total 3.2 1.5 - 4.5 g/dL   Bilirubin Total 0.7 0.0 - 1.2 mg/dL   Alkaline Phosphatase 69 41 - 116 IU/L   AST 15 0 - 40 IU/L   ALT 13 0 - 32 IU/L  Lipid panel   Collection Time: 07/16/24  2:30 PM  Result Value Ref Range   Cholesterol, Total 131 100 - 199 mg/dL   Triglycerides 891 0 - 149 mg/dL   HDL 71 >60 mg/dL   VLDL Cholesterol Cal 19 5 - 40 mg/dL   LDL Chol Calc (NIH) 41 0 - 99 mg/dL   Chol/HDL Ratio 1.8 0.0 - 4.4 ratio  VITAMIN D  25 Hydroxy (Vit-D Deficiency, Fractures)   Collection Time: 07/16/24  2:30 PM  Result Value Ref Range   Vit D, 25-Hydroxy 48.1 30.0 - 100.0 ng/mL  Iron , TIBC and Ferritin Panel   Collection Time: 07/16/24  2:31 PM  Result Value Ref Range   Total Iron  Binding Capacity 312 250 - 450 ug/dL   UIBC 731 868 - 574 ug/dL  Iron  44 27 - 159 ug/dL    Iron  Saturation 14 (L) 15 - 55 %   Ferritin 81 15 - 150 ng/mL       Assessment & Plan:   Problem List Items Addressed This Visit     Primary hypertension   BP slightly elevated in today, but not to a concerning level. Recommend continuation of hydrochlorothiazide  for ongoing management. Monitor diet and exercise to aid in control.       Relevant Orders   CBC with Differential/Platelet (Completed)   CMP14+EGFR (Completed)   Encounter for annual physical exam - Primary   CPE completed today. Review of HM activities and recommendations discussed and provided on AVS. Anticipatory guidance, diet, and exercise recommendations provided. Medications, allergies, and hx reviewed and updated as necessary. Orders placed as listed below.  Plan: - Labs ordered. Will make changes as necessary based on results.  - I will review these results and send recommendations via MyChart or a telephone call.  - F/U with CPE in 1 year or sooner for acute/chronic health needs as directed.        Relevant Orders   CBC with Differential/Platelet (Completed)   CMP14+EGFR (Completed)   Lipid panel (Completed)   Gastritis and gastroduodenitis   Relevant Medications   dexlansoprazole  (DEXILANT ) 60 MG capsule   famotidine (PEPCID) 20 MG tablet   Other Relevant Orders   CBC with Differential/Platelet (Completed)   CMP14+EGFR (Completed)   Lipid panel (Completed)   TMJ tenderness, right   Relevant Medications   methocarbamol (ROBAXIN) 500 MG tablet   Leith Hedlund menopause   Relevant Orders   Ambulatory referral to Obstetrics / Gynecology   Vitamin D  deficiency   Relevant Orders   VITAMIN D  25 Hydroxy (Vit-D Deficiency, Fractures) (Completed)   Iron  deficiency anemia   Relevant Orders   CBC with Differential/Platelet (Completed)   CMP14+EGFR (Completed)   Iron , TIBC and Ferritin Panel (Completed)   HSV infection   Relevant Medications   valACYclovir  (VALTREX ) 1000 MG tablet   Other Visit Diagnoses        GERD without esophagitis       Relevant Medications   dexlansoprazole  (DEXILANT ) 60 MG capsule   famotidine (PEPCID) 20 MG tablet   Other Relevant Orders   CBC with Differential/Platelet (Completed)   CMP14+EGFR (Completed)   Iron , TIBC and Ferritin Panel (Completed)     History of anemia       Relevant Orders   CBC with Differential/Platelet (Completed)     Encounter for screening mammogram for malignant neoplasm of breast       Relevant Orders   MM 3D SCREENING MAMMOGRAM BILATERAL BREAST      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 Pain is perceived in the ear due to nerve involvement. Previous use of meloxicam  provided some relief, but concerns about exacerbating GERD limit NSAID use. - Prescribe Robaxin (muscle relaxant) for nighttime use to help with jaw clenching. - Advise use of a heating pad to alleviate jaw pain. - Recommend avoiding chewing gum, taffy, and large foods that require wide mouth opening. - Discuss with dentist about a custom mouth guard for TMJ management.  Gastroesophageal reflux disease (GERD) Chronic GERD with symptoms primarily at night. Current treatment with Dexilant  is partially effective. She prefers to add famotidine to current regimen for nighttime symptoms. - Continue Dexilant  60 mg daily. - Add famotidine at bedtime  for breakthrough symptoms.  Genital herpes Genital herpes diagnosed two years ago, managed with episodic treatment of Valacyclovir  during outbreaks. No frequent outbreaks reported, so suppressive therapy is not indicated. She is aware of triggers such as stress and lack of rest. - Prescribe Valacyclovir  500 mg for episodic treatment of outbreaks.  Iron  deficiency Continues to take iron  supplements daily.  General Health Maintenance Mammogram referral needed due to insurance change. Colonoscopy performed five years  ago with polyp removal. Declines repeat.  - Coordinate with referral coordinator to find a mammogram provider that accepts her insurance. Follow up plan: Return in about 1 year (around 07/16/2025) for CPE.  NEXT PREVENTATIVE PHYSICAL DUE IN 1 YEAR.  PATIENT COUNSELING PROVIDED FOR ALL ADULT PATIENTS: A well balanced diet low in saturated fats, cholesterol, and moderation in carbohydrates.  This can be as simple as monitoring portion sizes and cutting back on sugary beverages such as soda and juice to start with.    Daily water consumption of at least 64 ounces.  Physical activity at least 180 minutes per week.  If just starting out, start 10 minutes a day and work your way up.   This can be as simple as taking the stairs instead of the elevator and walking 2-3 laps around the office  purposefully every day.   STD protection, partner selection, and regular testing if high risk.  Limited consumption of alcoholic beverages if alcohol is consumed. For men, I recommend no more than 14 alcoholic beverages per week, spread out throughout the week (max 2 per day). Avoid binge drinking or consuming large quantities of alcohol in one setting.  Please let me know if you feel you may need help with reduction or quitting alcohol consumption.   Avoidance of nicotine, if used. Please let me know if you feel you may need help with reduction or quitting nicotine use.   Daily mental health attention. This can be in the form of 5 minute daily meditation, prayer, journaling, yoga, reflection, etc.  Purposeful attention to your emotions and mental state can significantly improve your overall wellbeing  and  Health.  Please know that I am here to help you with all of your health care goals and am happy to work with you to find a solution that works best for you.  The greatest advice I have received with any changes in life are to take it one step at a time, that even means if all you can focus on is the  next 60 seconds, then do that and celebrate your victories.  With any changes in life, you will have set backs, and that is OK. The important thing to remember is, if you have a set back, it is not a failure, it is an opportunity to try again! Screening Testing Mammogram Every 1 -2 years based on history and risk factors Starting at age 48 Pap Smear Ages 21-39 every 3 years Ages 46-65 every 5 years with HPV testing More frequent testing may be required based on results and history Colon Cancer Screening Every 1-10 years based on test performed, risk factors, and history Starting at age 63 Bone Density Screening Every 2-10 years based on history Starting at age 50 for women Recommendations for men differ based on medication usage, history, and risk factors AAA Screening One time ultrasound Men 55-51 years old who have every smoked Lung Cancer Screening Low Dose Lung CT every 12 months Age 103-80 years with a 30 pack-year smoking history who still smoke  or who have quit within the last 15 years   Screening Labs Routine  Labs: Complete Blood Count (CBC), Complete Metabolic Panel (CMP), Cholesterol (Lipid Panel) Every 6-12 months based on history and medications May be recommended more frequently based on current conditions or previous results Hemoglobin A1c Lab Every 3-12 months based on history and previous results Starting at age 84 or earlier with diagnosis of diabetes, high cholesterol, BMI >26, and/or risk factors Frequent monitoring for patients with diabetes to ensure blood sugar control Thyroid  Panel (TSH) Every 6 months based on history, symptoms, and risk factors May be repeated more often if on medication HIV One time testing for all patients 56 and older May be repeated more frequently for patients with increased risk factors or exposure Hepatitis C One time testing for all patients 60 and older May be repeated more frequently for patients with increased risk factors  or exposure Gonorrhea, Chlamydia Every 12 months for all sexually active persons 13-24 years Additional monitoring may be recommended for those who are considered high risk or who have symptoms Every 12 months for any woman on birth control, regardless of sexual activity PSA Men 65-36 years old with risk factors Additional screening may be recommended from age 35-69 based on risk factors, symptoms, and history  Vaccine Recommendations Tetanus Booster All adults every 10 years Flu Vaccine All patients 6 months and older every year COVID Vaccine All patients 12 years and older Initial dosing with booster May recommend additional booster based on age and health history HPV Vaccine 2 doses all patients age 17-26 Dosing may be considered for patients over 26 Shingles Vaccine (Shingrix) 2 doses all adults 55 years and older Pneumonia (Pneumovax 23) All adults 65 years and older May recommend earlier dosing based on health history One year apart from Prevnar 13 Pneumonia (Prevnar 8) All adults 65 years and older Dosed 1 year after Pneumovax 23 Pneumonia (Prevnar 20) One time alternative to the two dosing of 13 and 23 For all adults with initial dose of 23, 20 is recommended 1 year later For all adults with initial dose of 13, 23 is still recommended as second option 1 year later

## 2024-07-16 ENCOUNTER — Ambulatory Visit: Admitting: Nurse Practitioner

## 2024-07-16 ENCOUNTER — Encounter: Payer: Self-pay | Admitting: Nurse Practitioner

## 2024-07-16 VITALS — BP 130/82 | HR 84 | Ht 69.5 in | Wt 209.8 lb

## 2024-07-16 DIAGNOSIS — D508 Other iron deficiency anemias: Secondary | ICD-10-CM

## 2024-07-16 DIAGNOSIS — K2970 Gastritis, unspecified, without bleeding: Secondary | ICD-10-CM

## 2024-07-16 DIAGNOSIS — M26621 Arthralgia of right temporomandibular joint: Secondary | ICD-10-CM

## 2024-07-16 DIAGNOSIS — Z862 Personal history of diseases of the blood and blood-forming organs and certain disorders involving the immune mechanism: Secondary | ICD-10-CM

## 2024-07-16 DIAGNOSIS — E559 Vitamin D deficiency, unspecified: Secondary | ICD-10-CM

## 2024-07-16 DIAGNOSIS — Z0000 Encounter for general adult medical examination without abnormal findings: Secondary | ICD-10-CM

## 2024-07-16 DIAGNOSIS — I10 Essential (primary) hypertension: Secondary | ICD-10-CM | POA: Diagnosis not present

## 2024-07-16 DIAGNOSIS — B009 Herpesviral infection, unspecified: Secondary | ICD-10-CM

## 2024-07-16 DIAGNOSIS — K219 Gastro-esophageal reflux disease without esophagitis: Secondary | ICD-10-CM

## 2024-07-16 DIAGNOSIS — E28319 Asymptomatic premature menopause: Secondary | ICD-10-CM

## 2024-07-16 DIAGNOSIS — K2990 Gastroduodenitis, unspecified, without bleeding: Secondary | ICD-10-CM

## 2024-07-16 DIAGNOSIS — Z1231 Encounter for screening mammogram for malignant neoplasm of breast: Secondary | ICD-10-CM

## 2024-07-16 MED ORDER — VALACYCLOVIR HCL 1 G PO TABS: 1000.0000 mg | ORAL_TABLET | Freq: Two times a day (BID) | ORAL | 6 refills | Status: AC

## 2024-07-16 MED ORDER — VALACYCLOVIR HCL 1 G PO TABS: 1000.0000 mg | ORAL_TABLET | Freq: Two times a day (BID) | ORAL | 0 refills | Status: DC

## 2024-07-16 MED ORDER — FAMOTIDINE 20 MG PO TABS: 20.0000 mg | ORAL_TABLET | Freq: Every day | ORAL | 3 refills | Status: AC

## 2024-07-16 MED ORDER — DEXLANSOPRAZOLE 60 MG PO CPDR
60.0000 mg | DELAYED_RELEASE_CAPSULE | Freq: Every day | ORAL | 3 refills | Status: AC
Start: 2024-07-16 — End: ?

## 2024-07-16 MED ORDER — METHOCARBAMOL 500 MG PO TABS: 500.0000 mg | ORAL_TABLET | Freq: Every evening | ORAL | 1 refills | Status: DC | PRN

## 2024-07-16 NOTE — Patient Instructions (Signed)
 Temporomandibular Joint Syndrome  Temporomandibular joint syndrome (TMJ syndrome) is a condition that causes pain in the temporomandibular joints. These joints are located near your ears and allow your jaw to open and close. For people with TMJ syndrome, chewing, biting, or other movements of the jaw can be difficult or painful. TMJ syndrome is often mild and goes away within a few weeks. However, sometimes the condition becomes a long-term (chronic) problem. What are the causes? This condition may be caused by: Grinding your teeth or clenching your jaw. Some people do this when they are stressed. Arthritis. An injury to the jaw. A head or neck injury. Teeth or dentures that are not aligned well. In some cases, the cause of TMJ syndrome may not be known. What are the signs or symptoms? The most common symptom of this condition is aching pain on the side of the head in the area of the TMJ. Other symptoms may include: Pain when moving your jaw, such as when chewing or biting. Not being able to open your jaw all the way. Making a clicking sound when you open your mouth. Headache. Earache. Neck or shoulder pain. How is this diagnosed? This condition may be diagnosed based on: Your symptoms and medical history. A physical exam. Your health care provider may check the range of motion of your jaw. Imaging tests, such as X-rays or an MRI. You may also need to see your dentist, who will check if your teeth and jaw are lined up correctly. How is this treated? TMJ syndrome often goes away on its own. If treatment is needed, it may include: Eating soft foods and applying ice or heat. Medicines to relieve pain or inflammation. Medicines or massage to relax the muscles. A splint, bite plate, or mouthpiece to prevent teeth grinding or jaw clenching. Relaxation techniques or counseling to help reduce stress. A therapy for pain in which an electrical current is applied to the nerves through the skin  (transcutaneous electrical nerve stimulation). Acupuncture. This may help to relieve pain. Jaw surgery. This is rarely needed. Follow these instructions at home:  Eating and drinking Eat a soft diet if you are having trouble chewing. Avoid foods that require a lot of chewing. Do not chew gum. General instructions Take over-the-counter and prescription medicines only as told by your health care provider. If directed, put ice on the painful area. To do this: Put ice in a plastic bag. Place a towel between your skin and the bag. Leave the ice on for 20 minutes, 2-3 times a day. Remove the ice if your skin turns bright red. This is very important. If you cannot feel pain, heat, or cold, you have a greater risk of damage to the area. Apply a warm, wet cloth (warm compress) to the painful area as told. Massage your jaw area and do any jaw stretching exercises as told by your health care provider. If you were given a splint, bite plate, or mouthpiece, wear it as told by your health care provider. Keep all follow-up visits. This is important. Where to find more information [employer]: [employer] Contact a health care provider if: You have trouble eating. You have new or worsening symptoms. Get help right away if: Your jaw locks. Summary Temporomandibular joint syndrome (TMJ syndrome) is a condition that causes pain in the temporomandibular joints. These joints are located near your ears and allow your jaw to open and close. TMJ syndrome is often mild and goes away within  a few weeks. However, sometimes the condition becomes a long-term (chronic) problem. Symptoms include an aching pain on the side of the head in the area of the TMJ, pain when chewing or biting, and being unable to open your jaw all the way. You may also make a clicking sound when you open your mouth. TMJ syndrome often goes away on its own. If treatment is needed, it may  include medicines to relieve pain, reduce inflammation, or relax the muscles. A splint, bite plate, or mouthpiece may also be used to prevent teeth grinding or jaw clenching. This information is not intended to replace advice given to you by your health care provider. Make sure you discuss any questions you have with your health care provider. Document Revised: 05/02/2021 Document Reviewed: 05/02/2021 Elsevier Patient Education  2024 [employer].

## 2024-07-17 LAB — CBC WITH DIFFERENTIAL/PLATELET
Basophils Absolute: 0 x10E3/uL (ref 0.0–0.2)
Basos: 1 %
EOS (ABSOLUTE): 0 x10E3/uL (ref 0.0–0.4)
Eos: 0 %
Hematocrit: 39.6 % (ref 34.0–46.6)
Hemoglobin: 12.9 g/dL (ref 11.1–15.9)
Immature Grans (Abs): 0 x10E3/uL (ref 0.0–0.1)
Immature Granulocytes: 0 %
Lymphocytes Absolute: 1.7 x10E3/uL (ref 0.7–3.1)
Lymphs: 35 %
MCH: 28.4 pg (ref 26.6–33.0)
MCHC: 32.6 g/dL (ref 31.5–35.7)
MCV: 87 fL (ref 79–97)
Monocytes Absolute: 0.3 x10E3/uL (ref 0.1–0.9)
Monocytes: 7 %
Neutrophils Absolute: 2.9 x10E3/uL (ref 1.4–7.0)
Neutrophils: 57 %
Platelets: 363 x10E3/uL (ref 150–450)
RBC: 4.54 x10E6/uL (ref 3.77–5.28)
RDW: 13.7 % (ref 11.7–15.4)
WBC: 5 x10E3/uL (ref 3.4–10.8)

## 2024-07-17 LAB — CMP14+EGFR
ALT: 13 IU/L (ref 0–32)
AST: 15 IU/L (ref 0–40)
Albumin: 4.6 g/dL (ref 3.9–4.9)
Alkaline Phosphatase: 69 IU/L (ref 41–116)
BUN/Creatinine Ratio: 21 (ref 9–23)
BUN: 27 mg/dL — ABNORMAL HIGH (ref 6–24)
Bilirubin Total: 0.7 mg/dL (ref 0.0–1.2)
CO2: 24 mmol/L (ref 20–29)
Calcium: 10.1 mg/dL (ref 8.7–10.2)
Chloride: 101 mmol/L (ref 96–106)
Creatinine, Ser: 1.28 mg/dL — ABNORMAL HIGH (ref 0.57–1.00)
Globulin, Total: 3.2 g/dL (ref 1.5–4.5)
Glucose: 103 mg/dL — ABNORMAL HIGH (ref 70–99)
Potassium: 4.1 mmol/L (ref 3.5–5.2)
Sodium: 140 mmol/L (ref 134–144)
Total Protein: 7.8 g/dL (ref 6.0–8.5)
eGFR: 52 mL/min/1.73 — ABNORMAL LOW (ref >59)

## 2024-07-17 LAB — LIPID PANEL
Chol/HDL Ratio: 1.8 ratio (ref 0.0–4.4)
Cholesterol, Total: 131 mg/dL (ref 100–199)
HDL: 71 mg/dL (ref >39)
LDL Chol Calc (NIH): 41 mg/dL (ref 0–99)
Triglycerides: 108 mg/dL (ref 0–149)
VLDL Cholesterol Cal: 19 mg/dL (ref 5–40)

## 2024-07-17 LAB — IRON,TIBC AND FERRITIN PANEL
Ferritin: 81 ng/mL (ref 15–150)
Iron Saturation: 14 % — ABNORMAL LOW (ref 15–55)
Iron: 44 ug/dL (ref 27–159)
Total Iron Binding Capacity: 312 ug/dL (ref 250–450)
UIBC: 268 ug/dL (ref 131–425)

## 2024-07-17 LAB — VITAMIN D 25 HYDROXY (VIT D DEFICIENCY, FRACTURES): Vit D, 25-Hydroxy: 48.1 ng/mL (ref 30.0–100.0)

## 2024-07-19 NOTE — Assessment & Plan Note (Signed)

## 2024-07-19 NOTE — Assessment & Plan Note (Signed)
 BP slightly elevated in today, but not to a concerning level. Recommend continuation of hydrochlorothiazide  for ongoing management. Monitor diet and exercise to aid in control.

## 2024-07-24 ENCOUNTER — Ambulatory Visit: Payer: Self-pay | Admitting: Nurse Practitioner

## 2024-07-24 DIAGNOSIS — N182 Chronic kidney disease, stage 2 (mild): Secondary | ICD-10-CM

## 2024-07-29 ENCOUNTER — Other Ambulatory Visit: Payer: Self-pay | Admitting: Nurse Practitioner

## 2024-07-29 ENCOUNTER — Other Ambulatory Visit: Payer: Self-pay | Admitting: Family Medicine

## 2024-07-29 DIAGNOSIS — H9201 Otalgia, right ear: Secondary | ICD-10-CM

## 2024-07-29 DIAGNOSIS — H66001 Acute suppurative otitis media without spontaneous rupture of ear drum, right ear: Secondary | ICD-10-CM

## 2024-08-05 ENCOUNTER — Telehealth: Payer: Self-pay

## 2024-08-05 NOTE — Telephone Encounter (Signed)
 I called the pts. Pharmacy since and they said the pt. Picked up a prescription on 07/04/24 for a quantity of 60 capsules so she should not be out. They said the earliest she could pick it up again would be 08/21/24. I called the pt. To let her know what they said and she said then why is out of her medicine then. She said she did not remember getting any dexilant  on 07/04/24 but she is going to check her medicine cabinet and call me back and let me know.    Copied from CRM #[id number]. Topic: Clinical - Prescription Issue >> Aug 05, 2024  8:23 AM Ahlexyia S wrote: Reason for CRM: Pt called in stating that she got a refill on medication dexlansoprazole  (DEXILANT ) 60 MG capsule on 10/14. When pt attempted to pick up medication she was unable to do so and was told that the script wouldn't be able to be filled until 11/19. Pt stated she needs the medication due to her being out and is wanting to know if it could be refilled. Pt is requesting a callback for this.

## 2024-09-11 ENCOUNTER — Other Ambulatory Visit: Payer: Self-pay | Admitting: Nurse Practitioner

## 2024-09-11 DIAGNOSIS — M26621 Arthralgia of right temporomandibular joint: Secondary | ICD-10-CM

## 2024-09-11 NOTE — Telephone Encounter (Signed)
 Last appt 07/16/24

## 2024-10-04 ENCOUNTER — Other Ambulatory Visit: Payer: Self-pay

## 2024-10-04 DIAGNOSIS — N182 Chronic kidney disease, stage 2 (mild): Secondary | ICD-10-CM

## 2024-10-05 LAB — COMPREHENSIVE METABOLIC PANEL WITH GFR
ALT: 11 IU/L (ref 0–32)
AST: 13 IU/L (ref 0–40)
Albumin: 4.4 g/dL (ref 3.9–4.9)
Alkaline Phosphatase: 78 IU/L (ref 41–116)
BUN/Creatinine Ratio: 15 (ref 9–23)
BUN: 16 mg/dL (ref 6–24)
Bilirubin Total: 1.1 mg/dL (ref 0.0–1.2)
CO2: 23 mmol/L (ref 20–29)
Calcium: 9.9 mg/dL (ref 8.7–10.2)
Chloride: 101 mmol/L (ref 96–106)
Creatinine, Ser: 1.1 mg/dL — ABNORMAL HIGH (ref 0.57–1.00)
Globulin, Total: 3 g/dL (ref 1.5–4.5)
Glucose: 96 mg/dL (ref 70–99)
Potassium: 3.7 mmol/L (ref 3.5–5.2)
Sodium: 142 mmol/L (ref 134–144)
Total Protein: 7.4 g/dL (ref 6.0–8.5)
eGFR: 62 mL/min/1.73

## 2024-10-09 ENCOUNTER — Ambulatory Visit: Payer: Self-pay | Admitting: Nurse Practitioner

## 2025-08-01 ENCOUNTER — Encounter: Payer: Self-pay | Admitting: Nurse Practitioner
